# Patient Record
Sex: Female | Born: 1955 | Race: Black or African American | Hispanic: No | Smoking: Never smoker
Health system: Southern US, Community
[De-identification: ages and names within clinical notes are randomized; demographics above are authoritative.]

## PROBLEM LIST (undated history)

## (undated) DIAGNOSIS — I10 Essential (primary) hypertension: Secondary | ICD-10-CM

## (undated) DIAGNOSIS — E785 Hyperlipidemia, unspecified: Secondary | ICD-10-CM

## (undated) DIAGNOSIS — R609 Edema, unspecified: Secondary | ICD-10-CM

## (undated) HISTORY — DX: Edema, unspecified: R60.9

## (undated) HISTORY — PX: TUBAL LIGATION: SHX77

## (undated) HISTORY — DX: Essential (primary) hypertension: I10

## (undated) HISTORY — DX: Hyperlipidemia, unspecified: E78.5

---

## 1997-09-28 ENCOUNTER — Inpatient Hospital Stay (HOSPITAL_COMMUNITY): Admission: AD | Admit: 1997-09-28 | Discharge: 1997-09-30 | Payer: Self-pay | Admitting: Obstetrics and Gynecology

## 1997-10-31 ENCOUNTER — Other Ambulatory Visit: Admission: RE | Admit: 1997-10-31 | Discharge: 1997-10-31 | Payer: Self-pay | Admitting: Obstetrics and Gynecology

## 1998-07-09 ENCOUNTER — Other Ambulatory Visit: Admission: RE | Admit: 1998-07-09 | Discharge: 1998-07-09 | Payer: Self-pay | Admitting: Obstetrics and Gynecology

## 2004-11-22 ENCOUNTER — Emergency Department (HOSPITAL_COMMUNITY): Admission: EM | Admit: 2004-11-22 | Discharge: 2004-11-22 | Payer: Self-pay | Admitting: Emergency Medicine

## 2005-01-21 ENCOUNTER — Other Ambulatory Visit: Admission: RE | Admit: 2005-01-21 | Discharge: 2005-01-21 | Payer: Self-pay | Admitting: Internal Medicine

## 2005-02-11 ENCOUNTER — Encounter: Admission: RE | Admit: 2005-02-11 | Discharge: 2005-02-11 | Payer: Self-pay | Admitting: Internal Medicine

## 2006-02-05 ENCOUNTER — Emergency Department (HOSPITAL_COMMUNITY): Admission: EM | Admit: 2006-02-05 | Discharge: 2006-02-05 | Payer: Self-pay | Admitting: Emergency Medicine

## 2006-08-04 ENCOUNTER — Other Ambulatory Visit: Admission: RE | Admit: 2006-08-04 | Discharge: 2006-08-04 | Payer: Self-pay | Admitting: Internal Medicine

## 2006-09-12 HISTORY — PX: EYE SURGERY: SHX253

## 2007-10-12 ENCOUNTER — Other Ambulatory Visit: Admission: RE | Admit: 2007-10-12 | Discharge: 2007-10-12 | Payer: Self-pay | Admitting: Internal Medicine

## 2008-05-23 ENCOUNTER — Ambulatory Visit: Payer: Self-pay | Admitting: Internal Medicine

## 2008-11-28 ENCOUNTER — Ambulatory Visit: Payer: Self-pay | Admitting: Internal Medicine

## 2009-08-07 ENCOUNTER — Ambulatory Visit: Payer: Self-pay | Admitting: Internal Medicine

## 2010-03-09 ENCOUNTER — Emergency Department (HOSPITAL_COMMUNITY): Admission: EM | Admit: 2010-03-09 | Discharge: 2010-03-09 | Payer: Self-pay | Admitting: Emergency Medicine

## 2010-03-22 ENCOUNTER — Ambulatory Visit: Payer: Self-pay | Admitting: Internal Medicine

## 2010-09-21 ENCOUNTER — Ambulatory Visit (INDEPENDENT_AMBULATORY_CARE_PROVIDER_SITE_OTHER): Payer: Self-pay | Admitting: Internal Medicine

## 2010-09-21 DIAGNOSIS — E119 Type 2 diabetes mellitus without complications: Secondary | ICD-10-CM

## 2010-10-27 ENCOUNTER — Other Ambulatory Visit: Payer: Self-pay | Admitting: Internal Medicine

## 2010-10-27 DIAGNOSIS — Z1231 Encounter for screening mammogram for malignant neoplasm of breast: Secondary | ICD-10-CM

## 2010-11-02 ENCOUNTER — Ambulatory Visit
Admission: RE | Admit: 2010-11-02 | Discharge: 2010-11-02 | Disposition: A | Discharge status: Home / Self Care | Payer: Commercial Managed Care - PPO | Source: Ambulatory Visit | Attending: Internal Medicine | Admitting: Internal Medicine

## 2010-11-02 DIAGNOSIS — Z1231 Encounter for screening mammogram for malignant neoplasm of breast: Secondary | ICD-10-CM

## 2010-11-12 ENCOUNTER — Ambulatory Visit: Payer: Commercial Managed Care - PPO

## 2011-04-19 ENCOUNTER — Encounter: Payer: Self-pay | Admitting: Internal Medicine

## 2011-04-22 ENCOUNTER — Encounter: Payer: Self-pay | Admitting: Internal Medicine

## 2011-04-22 ENCOUNTER — Other Ambulatory Visit (HOSPITAL_COMMUNITY)
Admission: RE | Admit: 2011-04-22 | Discharge: 2011-04-22 | Disposition: A | Discharge status: Home / Self Care | Payer: 59 | Source: Ambulatory Visit | Attending: Internal Medicine | Admitting: Internal Medicine

## 2011-04-22 ENCOUNTER — Ambulatory Visit (INDEPENDENT_AMBULATORY_CARE_PROVIDER_SITE_OTHER): Payer: 59 | Admitting: Internal Medicine

## 2011-04-22 ENCOUNTER — Other Ambulatory Visit: Payer: Self-pay | Admitting: Internal Medicine

## 2011-04-22 DIAGNOSIS — N951 Menopausal and female climacteric states: Secondary | ICD-10-CM

## 2011-04-22 DIAGNOSIS — E119 Type 2 diabetes mellitus without complications: Secondary | ICD-10-CM

## 2011-04-22 DIAGNOSIS — Z01419 Encounter for gynecological examination (general) (routine) without abnormal findings: Secondary | ICD-10-CM | POA: Insufficient documentation

## 2011-04-22 DIAGNOSIS — R609 Edema, unspecified: Secondary | ICD-10-CM | POA: Insufficient documentation

## 2011-04-22 DIAGNOSIS — H409 Unspecified glaucoma: Secondary | ICD-10-CM | POA: Insufficient documentation

## 2011-04-22 DIAGNOSIS — Z124 Encounter for screening for malignant neoplasm of cervix: Secondary | ICD-10-CM

## 2011-04-22 DIAGNOSIS — E559 Vitamin D deficiency, unspecified: Secondary | ICD-10-CM

## 2011-04-22 DIAGNOSIS — Z0000 Encounter for general adult medical examination without abnormal findings: Secondary | ICD-10-CM

## 2011-04-22 DIAGNOSIS — H109 Unspecified conjunctivitis: Secondary | ICD-10-CM

## 2011-04-22 LAB — CBC WITH DIFFERENTIAL/PLATELET
Basophils Relative: 0 % (ref 0–1)
Eosinophils Relative: 3 % (ref 0–5)
Lymphocytes Relative: 43 % (ref 12–46)
Lymphs Abs: 2.5 10*3/uL (ref 0.7–4.0)
MCH: 29.8 pg (ref 26.0–34.0)
Monocytes Relative: 6 % (ref 3–12)
Neutro Abs: 2.8 10*3/uL (ref 1.7–7.7)
Neutrophils Relative %: 48 % (ref 43–77)
RBC: 4.4 MIL/uL (ref 3.87–5.11)
WBC: 5.9 10*3/uL (ref 4.0–10.5)

## 2011-04-22 LAB — POCT URINALYSIS DIPSTICK
Bilirubin, UA: NEGATIVE
Blood, UA: NEGATIVE
Ketones, UA: NEGATIVE
Leukocytes, UA: NEGATIVE
Protein, UA: NEGATIVE
Urobilinogen, UA: NEGATIVE

## 2011-04-22 LAB — COMPREHENSIVE METABOLIC PANEL
Albumin: 4.1 g/dL (ref 3.5–5.2)
CO2: 27 mEq/L (ref 19–32)
Chloride: 102 mEq/L (ref 96–112)
Glucose, Bld: 86 mg/dL (ref 70–99)
Sodium: 139 mEq/L (ref 135–145)
Total Bilirubin: 0.7 mg/dL (ref 0.3–1.2)

## 2011-04-22 LAB — TSH: TSH: 1.11 u[IU]/mL (ref 0.350–4.500)

## 2011-04-22 LAB — HEMOGLOBIN A1C: Mean Plasma Glucose: 126 mg/dL — ABNORMAL HIGH (ref <117)

## 2011-04-22 LAB — LIPID PANEL
Cholesterol: 231 mg/dL — ABNORMAL HIGH (ref 0–200)
VLDL: 12 mg/dL (ref 0–40)

## 2011-04-22 NOTE — Progress Notes (Signed)
  Subjective:    Patient ID: Allison Shields, female    DOB: 1955/12/07, 55 y.o.   MRN: [id number]  HPI 55 year old black female with history of glaucoma, diabetes mellitus, dependent edema and vitamin D deficiency for health maintenance exam and evaluation of medical problems. Patient works as a Advertising copywriter at [employer]. Recently had a bout of conjunctivitis and saw Dr. Eulah Pont and was treated with ofloxacin ophthalmic drops. It seems to have recurred. We have arranged for her to see ophthalmologist this afternoon. Overall doing well with good control of her glucose intolerance just with diet alone. She's not been taking vitamin D supplement. Once again reminded her to take 2000 units vitamin D over-the-counter. She takes Maxzide 75/50 every other day for dependent edema. Says she's had a recent mammogram but I do not have copy of that. Became menopausal about a year ago. Had Pneumovax immunization 2007. Tetanus immunization 2010 he gets annual influenza immunization through employment. In April 2008 and glaucoma surgery right. Bilateral tubal ligation 1999. No Pap since 2009. Does get annual eye exam per ophthalmologist.    Review of Systems  Constitutional: Negative.   HENT: Negative.   Eyes: Positive for redness.  Respiratory: Negative.   Cardiovascular: Negative.   Gastrointestinal: Negative.   Genitourinary: Negative.   Musculoskeletal: Negative.   Skin: Negative.   Neurological: Negative.   Hematological: Negative.   Psychiatric/Behavioral: Negative.        Objective:   Physical Exam  Vitals reviewed. Constitutional: She is oriented to person, place, and time. She appears well-developed.  HENT:  Head: Normocephalic and atraumatic.  Right Ear: External ear normal.  Left Ear: External ear normal.  Mouth/Throat: Oropharynx is clear and moist. No oropharyngeal exudate.  Eyes: EOM are normal. Pupils are equal, round, and reactive to light. No scleral icterus.       Right eye has  "bubble" place to control ocular pressure. Right eye is red  Neck: Neck supple. No JVD present. No thyromegaly present.  Cardiovascular: Normal rate, regular rhythm, normal heart sounds and intact distal pulses.   No murmur heard. Pulmonary/Chest: Effort normal and breath sounds normal. She has no wheezes. She has no rales.  Abdominal: Soft. Bowel sounds are normal. She exhibits no mass. There is no rebound.  Genitourinary: Vagina normal and uterus normal. No vaginal discharge found.       Pap smear taken  Musculoskeletal: Normal range of motion. She exhibits no edema.  Lymphadenopathy:    She has no cervical adenopathy.  Neurological: She is alert and oriented to person, place, and time. She has normal reflexes. No cranial nerve deficit. Coordination normal.  Skin: Skin is warm and dry. No rash noted.  Psychiatric: She has a normal mood and affect. Her behavior is normal. Judgment and thought content normal.          Assessment & Plan:  Diabetes mellitus-diet controlled  Conjunctivitis right eye-appointment to see ophthalmologist this afternoo  History of glaucoma  Dependent edema  Plan: Return in 6 months for hemoglobin A1c. At her request phentermine 37.5 mg (#90) one by mouth daily to assist with weight loss efforts. We only prescribe this for her sparingly. Refill Maxzide 75/50 #90 one by mouth every other day with when necessary 1 year refill.   Menopausal-no menses in one year  Vitamin D deficiency-take 2000 units daily over-the-counter vitamin D 3

## 2011-04-22 NOTE — Patient Instructions (Signed)
Continue to watch diet and lose a little bit of weight. Take 2000 units vitamin D 3 over-the-counter daily. Have written prescription for new home glucose monitor. Return in 6 months.

## 2011-04-23 LAB — VITAMIN D 25 HYDROXY (VIT D DEFICIENCY, FRACTURES): Vit D, 25-Hydroxy: 43 ng/mL (ref 30–89)

## 2011-04-25 ENCOUNTER — Telehealth: Payer: Self-pay | Admitting: Internal Medicine

## 2011-04-25 ENCOUNTER — Other Ambulatory Visit: Payer: Self-pay

## 2011-04-25 MED ORDER — GLUCOSE BLOOD VI STRP: ORAL_STRIP | Status: AC

## 2011-04-25 NOTE — Telephone Encounter (Signed)
Rx written for #180 with prn one year refills to use bid.

## 2012-06-08 ENCOUNTER — Encounter: Payer: Self-pay | Admitting: Internal Medicine

## 2012-06-08 ENCOUNTER — Ambulatory Visit (INDEPENDENT_AMBULATORY_CARE_PROVIDER_SITE_OTHER): Payer: 59 | Admitting: Internal Medicine

## 2012-06-08 VITALS — BP 120/78 | HR 84 | Ht 63.75 in | Wt 211.0 lb

## 2012-06-08 DIAGNOSIS — R609 Edema, unspecified: Secondary | ICD-10-CM

## 2012-06-08 DIAGNOSIS — E119 Type 2 diabetes mellitus without complications: Secondary | ICD-10-CM

## 2012-06-08 DIAGNOSIS — Z0000 Encounter for general adult medical examination without abnormal findings: Secondary | ICD-10-CM

## 2012-06-08 DIAGNOSIS — E785 Hyperlipidemia, unspecified: Secondary | ICD-10-CM

## 2012-06-08 DIAGNOSIS — H409 Unspecified glaucoma: Secondary | ICD-10-CM

## 2012-06-08 DIAGNOSIS — E669 Obesity, unspecified: Secondary | ICD-10-CM

## 2012-06-08 DIAGNOSIS — E559 Vitamin D deficiency, unspecified: Secondary | ICD-10-CM

## 2012-06-08 LAB — COMPREHENSIVE METABOLIC PANEL
AST: 14 U/L (ref 0–37)
Albumin: 4.1 g/dL (ref 3.5–5.2)
BUN: 17 mg/dL (ref 6–23)
Glucose, Bld: 89 mg/dL (ref 70–99)

## 2012-06-08 LAB — POCT URINALYSIS DIPSTICK
Bilirubin, UA: NEGATIVE
Blood, UA: NEGATIVE
Nitrite, UA: NEGATIVE
Protein, UA: NEGATIVE
Spec Grav, UA: 1.015
pH, UA: 6

## 2012-06-08 LAB — CBC WITH DIFFERENTIAL/PLATELET
Basophils Absolute: 0 10*3/uL (ref 0.0–0.1)
MCH: 30.2 pg (ref 26.0–34.0)
MCHC: 34 g/dL (ref 30.0–36.0)
Monocytes Relative: 8 % (ref 3–12)
Neutro Abs: 3.9 10*3/uL (ref 1.7–7.7)
Neutrophils Relative %: 55 % (ref 43–77)
Platelets: 269 10*3/uL (ref 150–400)

## 2012-06-08 LAB — LIPID PANEL
Cholesterol: 224 mg/dL — ABNORMAL HIGH (ref 0–200)
HDL: 61 mg/dL (ref >39)
LDL Cholesterol: 146 mg/dL — ABNORMAL HIGH (ref 0–99)
Triglycerides: 86 mg/dL (ref <150)

## 2012-06-08 LAB — HEMOGLOBIN A1C
Hgb A1c MFr Bld: 6 % — ABNORMAL HIGH (ref <5.7)
Mean Plasma Glucose: 126 mg/dL — ABNORMAL HIGH (ref <117)

## 2012-06-08 NOTE — Patient Instructions (Addendum)
We have prescribed Maxide 75/50 for dependent edema to take daily. Previously was taking Maxide 25 daily. Have prescribed phentermine 37.5 mg to take 1 by mouth daily for weight loss #100 no refill. Patient encouraged diet and exercise. She's gained 11 pounds since last year.  Need to start Zocor 10 mg daily for hyperlipidemia and recheck in 6 months

## 2012-06-09 LAB — VITAMIN D 25 HYDROXY (VIT D DEFICIENCY, FRACTURES): Vit D, 25-Hydroxy: 49 ng/mL (ref 30–89)

## 2012-06-17 NOTE — Progress Notes (Signed)
  Subjective:    Patient ID: Allison Shields, female    DOB: 01-19-1956, 57 y.o.   MRN: [id number]  HPI 57 year old Black female with history of Dolcin diabetes, dependent edema for health maintenance and evaluation of medical problems. No known drug allergies.  Past medical history: Bilateral tubal ligation 1999. Glaucoma surgery right 2008.  Patient had Pneumovax immunization 2007. Tetanus immunization 2010. Gets annual influenza immunization through employment.  Ophthalmologist is Dr. Delford Field well [facility] location here in [place]. Gets annual mammogram. History of trichomonads August 2006. History of vitamin D deficiency.  Social history: Patient has a high school education. Is separated from her husband. Has 2 sons. 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  Family history: Father died of complications of a stroke with history of diabetes. Mother with history of hyperlipidemia and hypertension. One brother alive and well. One sister die with GI malignancy. One sister has sarcoidosis. Another sister with morbid obesity. Total of 7 sisters.  Patient does not smoke or consume alcohol.  Diabetes is diet controlled. History of moderate hyperlipidemia in 2012. LDL cholesterol was 161 with total cholesterol of 231 and HDL cholesterol of 58.    Review of Systems  Constitutional: Negative.   Eyes: Negative.   Respiratory: Negative.   Cardiovascular: Negative.   Gastrointestinal: Negative.   Genitourinary: Negative.   Neurological: Negative.   Hematological: Negative.   Psychiatric/Behavioral: Negative.        Objective:   Physical Exam  Vitals reviewed. Constitutional: She is oriented to person, place, and time. She appears well-developed and well-nourished. No distress.  HENT:  Head: Normocephalic and atraumatic.  Right Ear: External ear normal.  Left Ear: External ear normal.  Mouth/Throat: Oropharynx is clear and moist. No oropharyngeal exudate.    Eyes: Conjunctivae normal and EOM are normal. Pupils are equal, round, and reactive to light. Right eye exhibits no discharge. Left eye exhibits no discharge.  Neck: Neck supple. No JVD present. No thyromegaly present.  Cardiovascular: Normal rate, regular rhythm, normal heart sounds and intact distal pulses.   No murmur heard. Pulmonary/Chest: Effort normal and breath sounds normal. She has no wheezes. She has no rales.       Breasts normal female.  Abdominal: Soft. Bowel sounds are normal. She exhibits no distension and no mass. There is no tenderness. There is no rebound and no guarding.  Musculoskeletal: Normal range of motion. She exhibits no edema.  Lymphadenopathy:    She has no cervical adenopathy.  Neurological: She is oriented to person, place, and time. She has normal reflexes. She displays normal reflexes. No cranial nerve deficit. Coordination normal.  Skin: Skin is warm and dry. No rash noted. She is not diaphoretic.  Psychiatric: She has a normal mood and affect. Her behavior is normal. Judgment and thought content normal.          Assessment & Plan:  Type 2 diabetes mellitus diet controlled  Dependent edema  History of vitamin D deficiency  Glaucoma  Menopause-became menopausal approximately 2011  Hyperlipidemia  Plan: Refill Maxzide 75/50 to take daily for dependent edema. At her request prescribe phentermine 37.5 mg (#100) one by mouth daily for weight loss. She needs to lose 11 pounds she's gained since last year plus a bit more. Also they she should be on Zocor 10 mg daily for hyperlipidemia with plans to recheck lipid panel liver functions with office visit in 6 months.

## 2012-06-26 ENCOUNTER — Other Ambulatory Visit: Payer: Self-pay

## 2012-06-26 MED ORDER — SIMVASTATIN 10 MG PO TABS: 10.0000 mg | ORAL_TABLET | Freq: Every day | ORAL | Status: DC

## 2012-06-26 NOTE — Telephone Encounter (Signed)
Patient informed of lipids and the need to start Zocor. Rx sent to [facility]. Will recheck lipid and liver functions in 6 months. Patient is declining colonoscopy at this time. States she will do one later this year.

## 2012-10-26 ENCOUNTER — Other Ambulatory Visit: Payer: Self-pay

## 2012-10-26 DIAGNOSIS — Z1231 Encounter for screening mammogram for malignant neoplasm of breast: Secondary | ICD-10-CM

## 2012-11-09 ENCOUNTER — Ambulatory Visit
Admission: RE | Admit: 2012-11-09 | Discharge: 2012-11-09 | Disposition: A | Discharge status: Home / Self Care | Payer: 59 | Source: Ambulatory Visit

## 2012-11-09 DIAGNOSIS — Z1231 Encounter for screening mammogram for malignant neoplasm of breast: Secondary | ICD-10-CM

## 2012-12-21 ENCOUNTER — Ambulatory Visit: Payer: 59 | Admitting: Internal Medicine

## 2013-01-04 ENCOUNTER — Ambulatory Visit (INDEPENDENT_AMBULATORY_CARE_PROVIDER_SITE_OTHER): Payer: 59 | Admitting: Internal Medicine

## 2013-01-04 ENCOUNTER — Encounter: Payer: Self-pay | Admitting: Internal Medicine

## 2013-01-04 VITALS — BP 126/74 | HR 76 | Temp 98.0°F | Wt 210.0 lb

## 2013-01-04 DIAGNOSIS — Z79899 Other long term (current) drug therapy: Secondary | ICD-10-CM

## 2013-01-04 DIAGNOSIS — E8881 Metabolic syndrome: Secondary | ICD-10-CM | POA: Insufficient documentation

## 2013-01-04 DIAGNOSIS — E119 Type 2 diabetes mellitus without complications: Secondary | ICD-10-CM

## 2013-01-04 DIAGNOSIS — E669 Obesity, unspecified: Secondary | ICD-10-CM

## 2013-01-04 DIAGNOSIS — E785 Hyperlipidemia, unspecified: Secondary | ICD-10-CM

## 2013-01-04 LAB — HEPATIC FUNCTION PANEL
ALT: 15 U/L (ref 0–35)
AST: 16 U/L (ref 0–37)
Bilirubin, Direct: 0.1 mg/dL (ref 0.0–0.3)

## 2013-01-04 LAB — LIPID PANEL
HDL: 54 mg/dL (ref >39)
LDL Cholesterol: 119 mg/dL — ABNORMAL HIGH (ref 0–99)

## 2013-01-04 NOTE — Progress Notes (Signed)
  Subjective:    Patient ID: Allison Shields, female    DOB: May 01, 1956, 57 y.o.   MRN: [id number]  HPI  57 year old Black female with history of controlled Type 2 diabetes diet controlled only, dependent edema and hyperlipidemia for 6 month recheck. No complaints. Lost one pound since last office visit. Eye exam per Dr. Eulah Pont on [facility] seen in December 12, 2012.     Review of Systems     Objective:   Physical Exam skin is warm and dry. Diabetic foot exam has calluses on her heels. No ulcers. Pulses are normal in feet. Neck is supple without JVD thyromegaly or carotid bruits. Chest clear to auscultation. Cardiac exam regular rate and rhythm normal S1 and S2 without murmurs. Extremities without pitting edema.        Assessment & Plan:  Dependent edema-controlled with diuretic and stable  Diet controlled diabetes mellitus-hemoglobin A1c is drawn and is pending  Hyperlipidemia-has not been compliant with daily dose of statin medication. Results are pending.  Obesity-patient has lost only 1 pound in the past 6 months. She wants to try phentermine 37.5 mg gave her #100 with no refill. Have her she must it serious about diet exercise and needs to lose some weight.  Calluses on feet. Recommend moisturizer on a daily basis.  Plan: Return in 6 months for physical exam. Prescription given for new home glucose monitor. Her since broken and she's not been checking Accu-Cheks 2-3 times weekly as advised.

## 2013-01-04 NOTE — Patient Instructions (Addendum)
Recommend diet exercise and weight loss. Return in 6 months. New prescription for home glucose monitor. Take statin medication on a daily basis.

## 2013-01-07 ENCOUNTER — Other Ambulatory Visit: Payer: Self-pay | Admitting: Internal Medicine

## 2013-01-07 NOTE — Telephone Encounter (Signed)
Please send separate Rx as requested

## 2013-01-07 NOTE — Telephone Encounter (Signed)
Rxs written for glucose monitor and test strips to use daily

## 2013-04-15 ENCOUNTER — Other Ambulatory Visit: Payer: Self-pay | Admitting: Internal Medicine

## 2013-08-02 ENCOUNTER — Other Ambulatory Visit (HOSPITAL_COMMUNITY)
Admission: RE | Admit: 2013-08-02 | Discharge: 2013-08-02 | Disposition: A | Discharge status: Home / Self Care | Payer: 59 | Source: Ambulatory Visit | Attending: Internal Medicine | Admitting: Internal Medicine

## 2013-08-02 ENCOUNTER — Ambulatory Visit (INDEPENDENT_AMBULATORY_CARE_PROVIDER_SITE_OTHER): Payer: 59 | Admitting: Internal Medicine

## 2013-08-02 ENCOUNTER — Encounter: Payer: Self-pay | Admitting: Internal Medicine

## 2013-08-02 VITALS — BP 132/74 | HR 64 | Temp 98.4°F | Ht 63.75 in | Wt 218.0 lb

## 2013-08-02 DIAGNOSIS — Z8669 Personal history of other diseases of the nervous system and sense organs: Secondary | ICD-10-CM

## 2013-08-02 DIAGNOSIS — E669 Obesity, unspecified: Secondary | ICD-10-CM

## 2013-08-02 DIAGNOSIS — Z8679 Personal history of other diseases of the circulatory system: Secondary | ICD-10-CM

## 2013-08-02 DIAGNOSIS — Z01419 Encounter for gynecological examination (general) (routine) without abnormal findings: Secondary | ICD-10-CM | POA: Insufficient documentation

## 2013-08-02 DIAGNOSIS — Z130 Encounter for screening for diseases of the blood and blood-forming organs and certain disorders involving the immune mechanism: Secondary | ICD-10-CM

## 2013-08-02 DIAGNOSIS — E119 Type 2 diabetes mellitus without complications: Secondary | ICD-10-CM

## 2013-08-02 DIAGNOSIS — E785 Hyperlipidemia, unspecified: Secondary | ICD-10-CM

## 2013-08-02 DIAGNOSIS — Z23 Encounter for immunization: Secondary | ICD-10-CM

## 2013-08-02 DIAGNOSIS — Z1329 Encounter for screening for other suspected endocrine disorder: Secondary | ICD-10-CM

## 2013-08-02 DIAGNOSIS — E559 Vitamin D deficiency, unspecified: Secondary | ICD-10-CM

## 2013-08-02 DIAGNOSIS — Z79899 Other long term (current) drug therapy: Secondary | ICD-10-CM

## 2013-08-02 DIAGNOSIS — Z0000 Encounter for general adult medical examination without abnormal findings: Secondary | ICD-10-CM

## 2013-08-02 LAB — POCT URINALYSIS DIPSTICK
Bilirubin, UA: NEGATIVE
GLUCOSE UA: NEGATIVE
Ketones, UA: NEGATIVE
LEUKOCYTES UA: NEGATIVE
Nitrite, UA: NEGATIVE
Protein, UA: NEGATIVE
RBC UA: NEGATIVE
Spec Grav, UA: 1.02
Urobilinogen, UA: NEGATIVE
pH, UA: 6

## 2013-08-02 LAB — CBC WITH DIFFERENTIAL/PLATELET
BASOS PCT: 1 % (ref 0–1)
Basophils Absolute: 0.1 10*3/uL (ref 0.0–0.1)
EOS ABS: 0.1 10*3/uL (ref 0.0–0.7)
EOS PCT: 2 % (ref 0–5)
HEMATOCRIT: 35.5 % — AB (ref 36.0–46.0)
HEMOGLOBIN: 12 g/dL (ref 12.0–15.0)
LYMPHS ABS: 2.5 10*3/uL (ref 0.7–4.0)
LYMPHS PCT: 41 % (ref 12–46)
MCH: 30.4 pg (ref 26.0–34.0)
MCHC: 33.8 g/dL (ref 30.0–36.0)
MCV: 89.9 fL (ref 78.0–100.0)
MONO ABS: 0.5 10*3/uL (ref 0.1–1.0)
MONOS PCT: 8 % (ref 3–12)
Neutro Abs: 2.9 10*3/uL (ref 1.7–7.7)
Neutrophils Relative %: 48 % (ref 43–77)
PLATELETS: 268 10*3/uL (ref 150–400)
RBC: 3.95 MIL/uL (ref 3.87–5.11)
RDW: 13.4 % (ref 11.5–15.5)
WBC: 6 10*3/uL (ref 4.0–10.5)

## 2013-08-02 LAB — COMPREHENSIVE METABOLIC PANEL
ALK PHOS: 81 U/L (ref 39–117)
ALT: 11 U/L (ref 0–35)
AST: 14 U/L (ref 0–37)
Albumin: 3.8 g/dL (ref 3.5–5.2)
BUN: 14 mg/dL (ref 6–23)
CHLORIDE: 107 meq/L (ref 96–112)
CO2: 27 meq/L (ref 19–32)
Calcium: 8.8 mg/dL (ref 8.4–10.5)
Creat: 0.88 mg/dL (ref 0.50–1.10)
Glucose, Bld: 90 mg/dL (ref 70–99)
POTASSIUM: 3.9 meq/L (ref 3.5–5.3)
Sodium: 141 mEq/L (ref 135–145)
Total Bilirubin: 0.5 mg/dL (ref 0.2–1.2)
Total Protein: 6.9 g/dL (ref 6.0–8.3)

## 2013-08-02 LAB — LIPID PANEL
CHOL/HDL RATIO: 3.1 ratio
Cholesterol: 166 mg/dL (ref 0–200)
HDL: 53 mg/dL (ref >39)
LDL Cholesterol: 99 mg/dL (ref 0–99)
Triglycerides: 69 mg/dL (ref <150)
VLDL: 14 mg/dL (ref 0–40)

## 2013-08-02 LAB — TSH: TSH: 1.015 u[IU]/mL (ref 0.350–4.500)

## 2013-08-02 LAB — HEMOGLOBIN A1C
HEMOGLOBIN A1C: 6.3 % — AB (ref <5.7)
Mean Plasma Glucose: 134 mg/dL — ABNORMAL HIGH (ref <117)

## 2013-08-02 MED ORDER — PNEUMOCOCCAL VAC POLYVALENT 25 MCG/0.5ML IJ INJ: 0.5000 mL | INJECTION | INTRAMUSCULAR | Status: DC

## 2013-08-02 NOTE — Progress Notes (Signed)
Subjective:    Patient ID: Allison Shields, female    DOB: 1956-02-13, 58 y.o.   MRN: [id number]  HPI 58 year old 77 female in today for health maintenance exam. She has a history of diabetes mellitus which is diet controlled, hyperlipidemia treated with Zocor, metabolic syndrome, obesity, dependent edema. She takes diuretic for dependent edema. History of glaucoma and vitamin D deficiency. Patient thinks that Zocor which she takes at night has been causing  insomnia. Have asked her to take it during the day. Patient has gained 7 pounds since December 2013.  No known drug allergies.  Past medical history: Bilateral tubal ligation 1999. Glaucoma surgery right eye 2008. Ophthalmologist is at [facility] and she is seen q 6 months due to glaucoma.  Tetanus immunization 2010. Gets annual influenza immunization through employment. Pneumovax immunization 2007. Treated for trichomonads August 2006.  Social history: She has a high school education. Is separated from her husband. Has 2 sons. 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 Does not smoke or consume alcohol.  Family history: Father died of complications of a stroke with history of diabetes. Mother with history of hyperlipidemia, glaucoma, and hypertension. One brother alive and well. One sister died with GI malignancy. One sister with remote history of sarcoidosis. Another sister with morbid obesity. Total of 7 sisters.    Review of Systems  Constitutional: Negative.   All other systems reviewed and are negative.      Objective:   Physical Exam  Vitals reviewed. Constitutional: She is oriented to person, place, and time. She appears well-developed and well-nourished. No distress.  HENT:  Head: Normocephalic and atraumatic.  Right Ear: External ear normal.  Left Ear: External ear normal.  Mouth/Throat: Oropharynx is clear and moist. No oropharyngeal exudate.  Eyes: Conjunctivae and EOM are normal. Pupils are  equal, round, and reactive to light. Right eye exhibits no discharge. Left eye exhibits no discharge. No scleral icterus.  Neck: Neck supple. No JVD present. No thyromegaly present.  Cardiovascular: Normal rate, regular rhythm, normal heart sounds and intact distal pulses.   No murmur heard. Pulmonary/Chest: Effort normal and breath sounds normal. No respiratory distress. She has no wheezes. She has no rales. She exhibits no tenderness.  Breasts normal female  Abdominal: Bowel sounds are normal. She exhibits no distension and no mass. There is no tenderness. There is no rebound and no guarding.  Genitourinary:  Pap taken --bimanual normal  Musculoskeletal: She exhibits no edema.  Lymphadenopathy:    She has no cervical adenopathy.  Neurological: She is alert and oriented to person, place, and time. She has normal reflexes. She displays normal reflexes. No cranial nerve deficit. Coordination normal.  Skin: Skin is dry. No rash noted. She is not diaphoretic.  Psychiatric: She has a normal mood and affect. Her behavior is normal. Judgment and thought content normal.          Assessment & Plan:  Controlled type 2 diabetes mellitus but hemoglobin A1c has increased to 6.3% from 5.9%.  Obesity  History of glaucoma  History of dependent edema  Hyperlipidemia stable with statin therapy  Plan: At her request, phentermine 37.5 mg #30 written prescription with no refill 1 by mouth daily to help jumpstart weight loss. I'm concerned about diabetes mellitus not being as well controlled as previously. Admits to not following a diet recently prescription for home glucose monitoring diabetic test strips to check once or twice daily. Call me with results. Followup in 6 months instead of one  year.

## 2013-08-03 LAB — VITAMIN D 25 HYDROXY (VIT D DEFICIENCY, FRACTURES): Vit D, 25-Hydroxy: 50 ng/mL (ref 30–89)

## 2013-10-16 ENCOUNTER — Other Ambulatory Visit: Payer: Self-pay

## 2013-10-16 DIAGNOSIS — Z1231 Encounter for screening mammogram for malignant neoplasm of breast: Secondary | ICD-10-CM

## 2013-11-29 ENCOUNTER — Ambulatory Visit: Payer: 59

## 2013-12-06 ENCOUNTER — Ambulatory Visit
Admission: RE | Admit: 2013-12-06 | Discharge: 2013-12-06 | Disposition: A | Discharge status: Home / Self Care | Payer: 59 | Source: Ambulatory Visit

## 2013-12-06 ENCOUNTER — Encounter (INDEPENDENT_AMBULATORY_CARE_PROVIDER_SITE_OTHER): Payer: Self-pay

## 2013-12-06 DIAGNOSIS — Z1231 Encounter for screening mammogram for malignant neoplasm of breast: Secondary | ICD-10-CM

## 2014-01-05 NOTE — Patient Instructions (Signed)
Monitor Accu-Cheks at home and call with results. Continue statin medication. Take phentermine daily for 30 days for weight loss. Return in 6 months for office visit in hemoglobin A 1C.

## 2014-01-31 ENCOUNTER — Encounter: Payer: Self-pay | Admitting: Internal Medicine

## 2014-01-31 ENCOUNTER — Ambulatory Visit (INDEPENDENT_AMBULATORY_CARE_PROVIDER_SITE_OTHER): Payer: 59 | Admitting: Internal Medicine

## 2014-01-31 VITALS — BP 116/76 | HR 80 | Wt 206.0 lb

## 2014-01-31 DIAGNOSIS — E119 Type 2 diabetes mellitus without complications: Secondary | ICD-10-CM

## 2014-01-31 DIAGNOSIS — Z79899 Other long term (current) drug therapy: Secondary | ICD-10-CM

## 2014-01-31 DIAGNOSIS — I10 Essential (primary) hypertension: Secondary | ICD-10-CM

## 2014-01-31 DIAGNOSIS — E785 Hyperlipidemia, unspecified: Secondary | ICD-10-CM

## 2014-01-31 NOTE — Patient Instructions (Signed)
Return in 6 months for physical exam. Continue same medications. Phentermine 37.5 mg given today #100 one by mouth daily. Watch diet and exercise.

## 2014-01-31 NOTE — Progress Notes (Signed)
   Subjective:    Patient ID: ANGELIQUE CHEVALIER, female    DOB: [birth year], 58 y.o.   MRN: [id number]  HPI  58 year old Black Female in today for six-month followup of diabetes and hypertension. Says Accu-Cheks have been excellent. She's lost 12 pounds since last visit. Wants another refill on phentermine. She was given #100 in February. Says she's watching her diet. Not exercising as much. I think is eating a little too much fast food.    Review of Systems     Objective:   Physical Exam Neck is supple without thyromegaly or carotid bruits. Chest clear. Cardiac exam regular rate and rhythm. Extremities without edema.       Assessment & Plan:  Hematoma left anterior shin secondary to recent fall-resolving  Controlled type 2 diabetes  Hyperlipidemia  Plan: Phentermine 37.5 mg #100 one by mouth daily. Return in February 2016 for physical examination. Continue same medications. Lipid panel hemoglobin A1c and urine microalbumin done today.

## 2014-02-01 LAB — BASIC METABOLIC PANEL
BUN: 16 mg/dL (ref 6–23)
CHLORIDE: 97 meq/L (ref 96–112)
CO2: 28 mEq/L (ref 19–32)
CREATININE: 0.93 mg/dL (ref 0.50–1.10)
Calcium: 9.4 mg/dL (ref 8.4–10.5)
Glucose, Bld: 89 mg/dL (ref 70–99)
POTASSIUM: 3.7 meq/L (ref 3.5–5.3)
Sodium: 132 mEq/L — ABNORMAL LOW (ref 135–145)

## 2014-02-01 LAB — HEMOGLOBIN A1C
Hgb A1c MFr Bld: 6.2 % — ABNORMAL HIGH (ref <5.7)
Mean Plasma Glucose: 131 mg/dL — ABNORMAL HIGH (ref <117)

## 2014-02-01 LAB — HEPATIC FUNCTION PANEL
ALK PHOS: 84 U/L (ref 39–117)
ALT: 10 U/L (ref 0–35)
AST: 16 U/L (ref 0–37)
Albumin: 4 g/dL (ref 3.5–5.2)
BILIRUBIN DIRECT: 0.1 mg/dL (ref 0.0–0.3)
BILIRUBIN INDIRECT: 0.4 mg/dL (ref 0.2–1.2)
Total Bilirubin: 0.5 mg/dL (ref 0.2–1.2)
Total Protein: 7.8 g/dL (ref 6.0–8.3)

## 2014-02-01 LAB — LIPID PANEL
Cholesterol: 184 mg/dL (ref 0–200)
HDL: 68 mg/dL (ref >39)
LDL CALC: 103 mg/dL — AB (ref 0–99)
Total CHOL/HDL Ratio: 2.7 Ratio
Triglycerides: 65 mg/dL (ref <150)
VLDL: 13 mg/dL (ref 0–40)

## 2014-02-01 LAB — MICROALBUMIN, URINE: Microalb, Ur: 0.5 mg/dL (ref 0.00–1.89)

## 2014-02-07 ENCOUNTER — Ambulatory Visit: Payer: 59 | Admitting: Internal Medicine

## 2014-02-14 ENCOUNTER — Other Ambulatory Visit (INDEPENDENT_AMBULATORY_CARE_PROVIDER_SITE_OTHER): Payer: 59 | Admitting: Internal Medicine

## 2014-02-14 DIAGNOSIS — Z1211 Encounter for screening for malignant neoplasm of colon: Secondary | ICD-10-CM

## 2014-02-14 LAB — HEMOCCULT GUIAC POC 1CARD (OFFICE)
FECAL OCCULT BLD: NEGATIVE
FECAL OCCULT BLD: NEGATIVE
Fecal Occult Blood, POC: NEGATIVE

## 2014-08-08 ENCOUNTER — Encounter: Payer: 59 | Admitting: Internal Medicine

## 2014-08-15 ENCOUNTER — Ambulatory Visit (INDEPENDENT_AMBULATORY_CARE_PROVIDER_SITE_OTHER): Payer: 59 | Admitting: Internal Medicine

## 2014-08-15 ENCOUNTER — Telehealth: Payer: Self-pay | Admitting: Internal Medicine

## 2014-08-15 ENCOUNTER — Encounter: Payer: Self-pay | Admitting: Internal Medicine

## 2014-08-15 VITALS — BP 110/78 | HR 79 | Temp 98.0°F | Ht 63.75 in | Wt 207.0 lb

## 2014-08-15 DIAGNOSIS — Z130 Encounter for screening for diseases of the blood and blood-forming organs and certain disorders involving the immune mechanism: Secondary | ICD-10-CM

## 2014-08-15 DIAGNOSIS — R8290 Unspecified abnormal findings in urine: Secondary | ICD-10-CM | POA: Diagnosis not present

## 2014-08-15 DIAGNOSIS — E785 Hyperlipidemia, unspecified: Secondary | ICD-10-CM | POA: Diagnosis not present

## 2014-08-15 DIAGNOSIS — H409 Unspecified glaucoma: Secondary | ICD-10-CM

## 2014-08-15 DIAGNOSIS — N390 Urinary tract infection, site not specified: Secondary | ICD-10-CM

## 2014-08-15 DIAGNOSIS — Z0000 Encounter for general adult medical examination without abnormal findings: Secondary | ICD-10-CM | POA: Diagnosis not present

## 2014-08-15 DIAGNOSIS — Z1321 Encounter for screening for nutritional disorder: Secondary | ICD-10-CM

## 2014-08-15 DIAGNOSIS — L509 Urticaria, unspecified: Secondary | ICD-10-CM | POA: Diagnosis not present

## 2014-08-15 DIAGNOSIS — Z8639 Personal history of other endocrine, nutritional and metabolic disease: Secondary | ICD-10-CM

## 2014-08-15 DIAGNOSIS — E669 Obesity, unspecified: Secondary | ICD-10-CM | POA: Diagnosis not present

## 2014-08-15 DIAGNOSIS — E119 Type 2 diabetes mellitus without complications: Secondary | ICD-10-CM

## 2014-08-15 DIAGNOSIS — Z1329 Encounter for screening for other suspected endocrine disorder: Secondary | ICD-10-CM

## 2014-08-15 DIAGNOSIS — R609 Edema, unspecified: Secondary | ICD-10-CM

## 2014-08-15 DIAGNOSIS — Z13220 Encounter for screening for lipoid disorders: Secondary | ICD-10-CM

## 2014-08-15 LAB — CBC WITH DIFFERENTIAL/PLATELET
BASOS ABS: 0 10*3/uL (ref 0.0–0.1)
Basophils Relative: 0 % (ref 0–1)
EOS ABS: 0.1 10*3/uL (ref 0.0–0.7)
EOS PCT: 1 % (ref 0–5)
HCT: 39.3 % (ref 36.0–46.0)
HEMOGLOBIN: 13 g/dL (ref 12.0–15.0)
LYMPHS ABS: 2.8 10*3/uL (ref 0.7–4.0)
LYMPHS PCT: 35 % (ref 12–46)
MCH: 30.2 pg (ref 26.0–34.0)
MCHC: 33.1 g/dL (ref 30.0–36.0)
MCV: 91.2 fL (ref 78.0–100.0)
MPV: 10.2 fL (ref 8.6–12.4)
Monocytes Absolute: 0.5 10*3/uL (ref 0.1–1.0)
Monocytes Relative: 6 % (ref 3–12)
NEUTROS PCT: 58 % (ref 43–77)
Neutro Abs: 4.6 10*3/uL (ref 1.7–7.7)
Platelets: 297 10*3/uL (ref 150–400)
RBC: 4.31 MIL/uL (ref 3.87–5.11)
RDW: 13.3 % (ref 11.5–15.5)
WBC: 7.9 10*3/uL (ref 4.0–10.5)

## 2014-08-15 LAB — POCT URINALYSIS DIPSTICK
BILIRUBIN UA: NEGATIVE
Blood, UA: NEGATIVE
GLUCOSE UA: NEGATIVE
KETONES UA: NEGATIVE
Nitrite, UA: NEGATIVE
Protein, UA: NEGATIVE
SPEC GRAV UA: 1.015
Urobilinogen, UA: NEGATIVE
pH, UA: 7

## 2014-08-15 MED ORDER — METHYLPREDNISOLONE ACETATE 80 MG/ML IJ SUSP
80.0000 mg | Freq: Once | INTRAMUSCULAR | Status: AC
  Administered 2014-08-15: 80 mg via INTRAMUSCULAR

## 2014-08-15 MED ORDER — PHENTERMINE HCL 37.5 MG PO CAPS: 37.5000 mg | ORAL_CAPSULE | ORAL | Status: DC

## 2014-08-15 NOTE — Telephone Encounter (Signed)
Erroneous encounter

## 2014-08-16 LAB — LIPID PANEL
Cholesterol: 194 mg/dL (ref 0–200)
HDL: 67 mg/dL (ref >=46)
LDL Cholesterol: 110 mg/dL — ABNORMAL HIGH (ref 0–99)
Total CHOL/HDL Ratio: 2.9 Ratio
Triglycerides: 86 mg/dL (ref <150)
VLDL: 17 mg/dL (ref 0–40)

## 2014-08-16 LAB — COMPLETE METABOLIC PANEL WITH GFR
ALT: 12 U/L (ref 0–35)
AST: 14 U/L (ref 0–37)
Albumin: 4.1 g/dL (ref 3.5–5.2)
Alkaline Phosphatase: 86 U/L (ref 39–117)
BILIRUBIN TOTAL: 0.4 mg/dL (ref 0.2–1.2)
BUN: 18 mg/dL (ref 6–23)
CO2: 25 mEq/L (ref 19–32)
Calcium: 9.8 mg/dL (ref 8.4–10.5)
Chloride: 100 mEq/L (ref 96–112)
Creat: 1.02 mg/dL (ref 0.50–1.10)
GFR, EST NON AFRICAN AMERICAN: 62 mL/min
GFR, Est African American: 71 mL/min
GLUCOSE: 96 mg/dL (ref 70–99)
POTASSIUM: 4.3 meq/L (ref 3.5–5.3)
SODIUM: 138 meq/L (ref 135–145)
Total Protein: 8.1 g/dL (ref 6.0–8.3)

## 2014-08-16 LAB — HEMOGLOBIN A1C
Hgb A1c MFr Bld: 6.2 % — ABNORMAL HIGH (ref <5.7)
Mean Plasma Glucose: 131 mg/dL — ABNORMAL HIGH (ref <117)

## 2014-08-16 LAB — MICROALBUMIN / CREATININE URINE RATIO
CREATININE, URINE: 77.1 mg/dL
MICROALB UR: 2.1 mg/dL — AB (ref <2.0)
Microalb Creat Ratio: 27.2 mg/g (ref 0.0–30.0)

## 2014-08-16 LAB — TSH: TSH: 0.842 u[IU]/mL (ref 0.350–4.500)

## 2014-08-16 LAB — VITAMIN D 25 HYDROXY (VIT D DEFICIENCY, FRACTURES): Vit D, 25-Hydroxy: 51 ng/mL (ref 30–100)

## 2014-08-17 LAB — URINE CULTURE: Colony Count: >=100000

## 2014-08-18 ENCOUNTER — Telehealth: Payer: Self-pay | Admitting: *Deleted

## 2014-08-18 MED ORDER — AMOXICILLIN 500 MG PO CAPS: 500.0000 mg | ORAL_CAPSULE | Freq: Three times a day (TID) | ORAL | Status: DC

## 2014-08-18 NOTE — Telephone Encounter (Signed)
Spoke with patient reviewed labs and gave patient instructions for antibiotic use

## 2014-08-20 ENCOUNTER — Other Ambulatory Visit: Payer: Self-pay | Admitting: Internal Medicine

## 2014-08-22 ENCOUNTER — Encounter: Payer: 59 | Admitting: Internal Medicine

## 2014-09-06 NOTE — Progress Notes (Signed)
Subjective:    Patient ID: Allison Shields, female    DOB: December 20, 1955, 59 y.o.   MRN: [id number]  HPI 59 year old black female in today for health maintenance exam. Has a history of diabetes mellitus which is diet control. History of hyperlipidemia treated with Zocor. History of metabolic syndrome, obesity, dependent edema. She takes diuretic for dependent edema. History of glaucoma and vitamin D deficiency.  No known drug allergies.  Past medical history: Bilateral tubal ligation 1999. Glaucoma surgery right eye 2008. Ophthalmologist is [facility] ophthalmology. She is seeing every 6 months due to glaucoma.  Tetanus immunization 2010. She gets annual influenza immunization through employment. Pneumovax immunization 2007.  History of trichomonads treated August 2006.  Social history: She has a high school education. She is separated from her husband. Has 2 sons. 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 Does not smoke or consume alcohol. Mother is Allison Shields who is also a patient here.  Family history: Father died of complications of a stroke with history of diabetes. Mother with history of hyperlipidemia, glaucoma, hypertension. One brother alive and well. One sister died with GI malignancy. One sister with remote history of sarcoidosis. Another sister with morbid obesity. Total of 7 sisters.  New complaint today is urticaria. This is been occurring now for several weeks. Doesn't understand what is causing it. Denies being under excessive stress.    Review of Systems  Constitutional: Negative.   All other systems reviewed and are negative.      Objective:   Physical Exam  Constitutional: She is oriented to person, place, and time. She appears well-developed and well-nourished. No distress.  HENT:  Head: Normocephalic and atraumatic.  Right Ear: External ear normal.  Left Ear: External ear normal.  Mouth/Throat: Oropharynx is clear and moist. No oropharyngeal exudate.    Eyes: Conjunctivae and EOM are normal. Pupils are equal, round, and reactive to light. Right eye exhibits no discharge. Left eye exhibits no discharge. No scleral icterus.  Neck: Neck supple. No JVD present. No thyromegaly present.  Cardiovascular: Normal rate, regular rhythm, normal heart sounds and intact distal pulses.   No murmur heard. Pulmonary/Chest: Effort normal and breath sounds normal. No respiratory distress. She has no wheezes. She has no rales. She exhibits no tenderness.  Breasts normal female without masses  Abdominal: Soft. Bowel sounds are normal. She exhibits no distension and no mass. There is no tenderness. There is no rebound and no guarding.  Genitourinary:  Pap taken 2015. Bimanual normal.  Musculoskeletal: Normal range of motion. She exhibits no edema.  Lymphadenopathy:    She has no cervical adenopathy.  Neurological: She is alert and oriented to person, place, and time. She has normal reflexes. No cranial nerve deficit. Coordination normal.  Skin: Skin is warm and dry. No rash noted. She is not diaphoretic.  Psychiatric: She has a normal mood and affect. Her behavior is normal. Judgment and thought content normal.  Vitals reviewed.         Assessment & Plan:  Controlled type 2 diabetes mellitus-controlled with diet. Hemoglobin A1c 6.2%  Hyperlipidemia-on statin therapy. LDL is 110. Encouraged diet exercise and weight loss  Obesity-last physical exam weight 218 pounds. Now weighs 207 pounds.  Metabolic syndrome  Dependent edema-treated with diuretic  History of glaucoma-seen in [facility] ophthalmology every 6 months  History of vitamin D deficiency-vitamin D level is normal at this time  Abnormal urinalysis-has 3+ LE on dipstick urine. Culture grew strep agalactiae. Treated with amoxicillin 500 mg 3  times daily for 7 days.  Urticaria-recommend Claritin 10 mg daily  Plan: Phentermine 37.5 mg #100 one by mouth daily for weight loss. This helps  jumpstart her weight loss efforts.

## 2014-09-07 ENCOUNTER — Encounter: Payer: Self-pay | Admitting: Internal Medicine

## 2014-09-07 NOTE — Patient Instructions (Signed)
Continue same medications. Work on diet exercise and weight loss. Take phentermine 37.5 mg daily for 3 months. Need to figure out a way to keep weight off.

## 2014-10-10 ENCOUNTER — Other Ambulatory Visit: Payer: Self-pay | Admitting: Internal Medicine

## 2014-11-21 ENCOUNTER — Other Ambulatory Visit: Payer: Self-pay

## 2014-11-21 DIAGNOSIS — Z1231 Encounter for screening mammogram for malignant neoplasm of breast: Secondary | ICD-10-CM

## 2014-12-23 ENCOUNTER — Ambulatory Visit
Admission: RE | Admit: 2014-12-23 | Discharge: 2014-12-23 | Disposition: A | Discharge status: Home / Self Care | Payer: 59 | Source: Ambulatory Visit

## 2014-12-23 DIAGNOSIS — Z1231 Encounter for screening mammogram for malignant neoplasm of breast: Secondary | ICD-10-CM

## 2015-02-20 ENCOUNTER — Ambulatory Visit: Payer: 59 | Admitting: Internal Medicine

## 2015-02-27 ENCOUNTER — Ambulatory Visit (INDEPENDENT_AMBULATORY_CARE_PROVIDER_SITE_OTHER): Payer: 59 | Admitting: Internal Medicine

## 2015-02-27 ENCOUNTER — Encounter: Payer: Self-pay | Admitting: Internal Medicine

## 2015-02-27 VITALS — BP 110/80 | HR 72 | Temp 98.0°F | Wt 204.0 lb

## 2015-02-27 DIAGNOSIS — M545 Low back pain, unspecified: Secondary | ICD-10-CM

## 2015-02-27 DIAGNOSIS — R5383 Other fatigue: Secondary | ICD-10-CM

## 2015-02-27 DIAGNOSIS — E669 Obesity, unspecified: Secondary | ICD-10-CM

## 2015-02-27 DIAGNOSIS — E119 Type 2 diabetes mellitus without complications: Secondary | ICD-10-CM

## 2015-02-27 DIAGNOSIS — E785 Hyperlipidemia, unspecified: Secondary | ICD-10-CM | POA: Diagnosis not present

## 2015-02-27 LAB — VITAMIN B12: Vitamin B-12: 720 pg/mL (ref 211–911)

## 2015-02-27 LAB — TSH: TSH: 1.429 u[IU]/mL (ref 0.350–4.500)

## 2015-02-27 LAB — LIPID PANEL
Cholesterol: 174 mg/dL (ref 125–200)
HDL: 59 mg/dL (ref >=46)
LDL CALC: 100 mg/dL (ref <130)
Total CHOL/HDL Ratio: 2.9 Ratio (ref <=5.0)
Triglycerides: 76 mg/dL (ref <150)
VLDL: 15 mg/dL (ref <30)

## 2015-02-27 MED ORDER — CYCLOBENZAPRINE HCL 10 MG PO TABS: ORAL_TABLET | ORAL | Status: DC

## 2015-02-28 LAB — HEMOGLOBIN A1C
Hgb A1c MFr Bld: 6.4 % — ABNORMAL HIGH (ref <5.7)
Mean Plasma Glucose: 137 mg/dL — ABNORMAL HIGH (ref <117)

## 2015-03-07 NOTE — Patient Instructions (Addendum)
Please work on diet exercise and weight loss. Continue Zocor. Take Flexeril as needed for musculoskeletal pain. Return in March 2017 for physical exam

## 2015-03-07 NOTE — Progress Notes (Signed)
   Subjective:    Patient ID: Allison Shields, female    DOB: July 28, 1955, 59 y.o.   MRN: [id number]  HPI In today to follow-up on controlled type 2 diabetes mellitus. Patient complaining of fatigue. TSH and B-12 levels normal. Hemoglobin A1c has increased 6.4%. Needs to get strict about diet and exercise. Cannot rely on diet pills alone.  She has a history of dependent edema for which she takes diuretic. Type 2 diabetes mellitus treated with diet. Issues with musculoskeletal pain. Muscle relaxant given.  Review of Systems     Objective:   Physical Exam Neck is supple without JVD thyromegaly or carotid bruits. Chest clear. Cardiac exam regular rate and rhythm normal S1 and S2. Extremities without pitting edema. Straight leg raising is negative at 90 bilaterally with normal muscle strength and deep tendon reflexes.       Assessment & Plan:  Dependent edema-when necessary diuretic therapy  Hyperlipidemia-continue statin medication  Controlled type 2 diabetes mellitus  Obesity  Musculoskeletal pain-left-sided low back pain without sciatica. Prescribed Flexeril.  Plan: Patient needs to get serious about diet exercise and weight loss. Cannot rely on diet pills alone.

## 2015-04-01 ENCOUNTER — Other Ambulatory Visit: Payer: Self-pay | Admitting: Internal Medicine

## 2015-04-02 ENCOUNTER — Other Ambulatory Visit: Payer: Self-pay | Admitting: Internal Medicine

## 2015-07-15 MED FILL — SIMVASTATIN 10 MG TABLET: 10 | 90 days supply | Qty: 90 | Fill #1

## 2015-07-15 MED FILL — TRIAMTERENE/HCTZ 75/50 TAB: 75-50 | 90 days supply | Qty: 90 | Fill #3

## 2015-08-28 ENCOUNTER — Ambulatory Visit (INDEPENDENT_AMBULATORY_CARE_PROVIDER_SITE_OTHER): Payer: 59 | Admitting: Internal Medicine

## 2015-08-28 ENCOUNTER — Encounter: Payer: Self-pay | Admitting: Internal Medicine

## 2015-08-28 VITALS — BP 132/80 | HR 79 | Temp 97.9°F | Ht 63.5 in | Wt 214.0 lb

## 2015-08-28 DIAGNOSIS — E669 Obesity, unspecified: Secondary | ICD-10-CM

## 2015-08-28 DIAGNOSIS — R7302 Impaired glucose tolerance (oral): Secondary | ICD-10-CM | POA: Diagnosis not present

## 2015-08-28 DIAGNOSIS — H40009 Preglaucoma, unspecified, unspecified eye: Secondary | ICD-10-CM | POA: Diagnosis not present

## 2015-08-28 DIAGNOSIS — E785 Hyperlipidemia, unspecified: Secondary | ICD-10-CM

## 2015-08-28 DIAGNOSIS — E6609 Other obesity due to excess calories: Secondary | ICD-10-CM | POA: Diagnosis not present

## 2015-08-28 DIAGNOSIS — Z0000 Encounter for general adult medical examination without abnormal findings: Secondary | ICD-10-CM | POA: Diagnosis not present

## 2015-08-28 DIAGNOSIS — E559 Vitamin D deficiency, unspecified: Secondary | ICD-10-CM

## 2015-08-28 DIAGNOSIS — H409 Unspecified glaucoma: Secondary | ICD-10-CM | POA: Diagnosis not present

## 2015-08-28 DIAGNOSIS — R609 Edema, unspecified: Secondary | ICD-10-CM

## 2015-08-28 LAB — HEMOGLOBIN A1C
HEMOGLOBIN A1C: 6.2 % — AB (ref <5.7)
MEAN PLASMA GLUCOSE: 131 mg/dL — AB (ref <117)

## 2015-08-28 LAB — CBC WITH DIFFERENTIAL/PLATELET
Basophils Absolute: 0 10*3/uL (ref 0.0–0.1)
Basophils Relative: 0 % (ref 0–1)
EOS ABS: 0.2 10*3/uL (ref 0.0–0.7)
EOS PCT: 2 % (ref 0–5)
HCT: 37 % (ref 36.0–46.0)
Hemoglobin: 12.3 g/dL (ref 12.0–15.0)
LYMPHS ABS: 2.4 10*3/uL (ref 0.7–4.0)
Lymphocytes Relative: 31 % (ref 12–46)
MCH: 30.3 pg (ref 26.0–34.0)
MCHC: 33.2 g/dL (ref 30.0–36.0)
MCV: 91.1 fL (ref 78.0–100.0)
MONOS PCT: 7 % (ref 3–12)
MPV: 10.1 fL (ref 8.6–12.4)
Monocytes Absolute: 0.5 10*3/uL (ref 0.1–1.0)
Neutro Abs: 4.6 10*3/uL (ref 1.7–7.7)
Neutrophils Relative %: 60 % (ref 43–77)
Platelets: 300 10*3/uL (ref 150–400)
RBC: 4.06 MIL/uL (ref 3.87–5.11)
RDW: 13.3 % (ref 11.5–15.5)
WBC: 7.6 10*3/uL (ref 4.0–10.5)

## 2015-08-28 LAB — COMPREHENSIVE METABOLIC PANEL
ALT: 13 U/L (ref 6–29)
AST: 14 U/L (ref 10–35)
Albumin: 3.8 g/dL (ref 3.6–5.1)
Alkaline Phosphatase: 85 U/L (ref 33–130)
BUN: 18 mg/dL (ref 7–25)
CHLORIDE: 103 mmol/L (ref 98–110)
CO2: 24 mmol/L (ref 20–31)
Calcium: 9.4 mg/dL (ref 8.6–10.4)
Creat: 1.2 mg/dL — ABNORMAL HIGH (ref 0.50–1.05)
GLUCOSE: 92 mg/dL (ref 65–99)
POTASSIUM: 4.1 mmol/L (ref 3.5–5.3)
Sodium: 138 mmol/L (ref 135–146)
Total Bilirubin: 0.5 mg/dL (ref 0.2–1.2)
Total Protein: 8 g/dL (ref 6.1–8.1)

## 2015-08-28 LAB — LIPID PANEL
Cholesterol: 202 mg/dL — ABNORMAL HIGH (ref 125–200)
HDL: 62 mg/dL (ref >=46)
LDL Cholesterol: 125 mg/dL (ref <130)
Total CHOL/HDL Ratio: 3.3 Ratio (ref <=5.0)
Triglycerides: 74 mg/dL (ref <150)
VLDL: 15 mg/dL (ref <30)

## 2015-08-28 LAB — POCT URINALYSIS DIPSTICK
Bilirubin, UA: NEGATIVE
GLUCOSE UA: NEGATIVE
Ketones, UA: NEGATIVE
Leukocytes, UA: NEGATIVE
NITRITE UA: NEGATIVE
Protein, UA: NEGATIVE
RBC UA: NEGATIVE
Spec Grav, UA: 1.015
UROBILINOGEN UA: NEGATIVE
pH, UA: 5

## 2015-08-28 LAB — TSH: TSH: 1.04 m[IU]/L

## 2015-08-28 MED ORDER — PHENTERMINE HCL 37.5 MG PO CAPS: 37.5000 mg | ORAL_CAPSULE | ORAL | Status: DC

## 2015-08-28 NOTE — Patient Instructions (Addendum)
Take Phentermine as directed for weight loss. Diet and exercise are important. RTC in one year or as needed. Stop regular sodas.

## 2015-08-29 DIAGNOSIS — R7302 Impaired glucose tolerance (oral): Secondary | ICD-10-CM | POA: Insufficient documentation

## 2015-08-29 LAB — VITAMIN D 25 HYDROXY (VIT D DEFICIENCY, FRACTURES): Vit D, 25-Hydroxy: 43 ng/mL (ref 30–100)

## 2015-08-29 NOTE — Progress Notes (Signed)
   Subjective:    Patient ID: Allison Shields, female    DOB: [birth year], 58 y.o.   MRN: [id number]  HPI 60 year old Black Female in today for health maintenance exam and evaluation of medical problems. Impaired glucose tolerance controlled by diet. History of hyperlipidemia treated with Zocor. History of metabolic syndrome, obesity, dependent edema. She takes diuretic for dependent edema. History of glaucoma followed [facility] regularly. History of vitamin D deficiency.  No known drug allergies.  Past medical history: Bilateral tubal ligation 1999. Glaucoma surgery right eye 2008.  History of trichomonads treated August 2006.  Tetanus immunization 2010. Gets annual flu vaccine through employment at [employer].  Social history: She has a high school education. She is separated from her husband. She has 2 sons. Youngest son who resides with her attends [employer] and his girlfriend is expecting a baby. She works at [employer] in [employer] on [address]  does not smoke or consume alcohol. Her mother is [name] who is also a patient here.  Family history: Father died of complications of a stroke with history of diabetes. Mother with history of hyperlipidemia, glaucoma, hypertension. One brother alive and well. One sister died of GI malignancy. One sister with remote history of sarcoidosis. Another sister with morbid obesity. Total of 7 sisters.  Last year, physical exam, she had issues with urticaria but that resolved with antihistamine treatment.        Review of Systems  Constitutional: Negative.   All other systems reviewed and are negative.      Objective:   Physical Exam  Constitutional: She is oriented to person, place, and time. She appears well-developed and well-nourished. No distress.  HENT:  Head: Normocephalic and atraumatic.  Right Ear: External ear normal.  Left Ear: External ear normal.  Mouth/Throat:  Oropharynx is clear and moist. No oropharyngeal exudate.  Eyes: Conjunctivae and EOM are normal. Pupils are equal, round, and reactive to light. Right eye exhibits no discharge. Left eye exhibits no discharge. No scleral icterus.  Neck: Neck supple. No JVD present. No thyromegaly present.  Cardiovascular: Normal rate, regular rhythm, normal heart sounds and intact distal pulses.   No murmur heard. Pulmonary/Chest: Effort normal and breath sounds normal. She has no wheezes. She has no rales.  Abdominal: Soft. Bowel sounds are normal. She exhibits no distension and no mass. There is no tenderness. There is no rebound.  Genitourinary:  Pap deferred. Bimanual normal. Pap was done in 2015.  Musculoskeletal: She exhibits no edema.  Neurological: She is alert and oriented to person, place, and time. She has normal reflexes. No cranial nerve deficit. Coordination normal.  Skin: Skin is warm and dry. No rash noted. She is not diaphoretic.  Psychiatric: She has a normal mood and affect. Her behavior is normal. Judgment and thought content normal.  Vitals reviewed.         Assessment & Plan:  Normal health maintenance exam  Recommend annual mammogram  Vitamin D deficiency-vitamin D level is normal  Obesity-encouraged diet and exercise and calorie restriction. Talk with her about portions. Prescribed phentermine 37.5 mg #100 one by mouth daily with no refill-- she usually gets one prescription for year  Dependent edema-treated with diuretic  Metabolic syndrome  Impaired glucose tolerance-hemoglobin A1c 6.2% and last year was 6.4%  Hyperlipidemia-treated with statin. Lipid panel within normal limits  Plan:

## 2015-09-01 ENCOUNTER — Telehealth: Payer: Self-pay

## 2015-09-01 NOTE — Telephone Encounter (Signed)
Left message advising patient to contact office. Per Dr. Renold Genta cannot see in patients chart where patient has ever had colonoscopy. I need to know if she has and if not if she will consider having cologuard?

## 2015-09-11 DIAGNOSIS — H4031X3 Glaucoma secondary to eye trauma, right eye, severe stage: Secondary | ICD-10-CM | POA: Diagnosis not present

## 2015-10-16 ENCOUNTER — Other Ambulatory Visit: Payer: Self-pay | Admitting: Internal Medicine

## 2015-10-16 MED FILL — TRIAMTERENE/HCTZ 75/50 TAB: 75-50 | 90 days supply | Qty: 90 | Fill #0

## 2015-10-16 MED FILL — SIMVASTATIN 10 MG TABLET: 10 | 90 days supply | Qty: 90 | Fill #2

## 2015-11-06 ENCOUNTER — Other Ambulatory Visit: Payer: Self-pay

## 2015-11-06 DIAGNOSIS — Z1231 Encounter for screening mammogram for malignant neoplasm of breast: Secondary | ICD-10-CM

## 2016-01-19 ENCOUNTER — Other Ambulatory Visit: Payer: Self-pay | Admitting: Internal Medicine

## 2016-01-19 ENCOUNTER — Telehealth: Payer: Self-pay | Admitting: Internal Medicine

## 2016-01-19 DIAGNOSIS — Z1231 Encounter for screening mammogram for malignant neoplasm of breast: Secondary | ICD-10-CM

## 2016-01-19 NOTE — Telephone Encounter (Signed)
Ms. Engeman called asking if she needs a six month or one year follow-up appointment with Dr. Renold Genta. I looked in her chart at the notes from her appointment in March 2017 but didn't see anything noted. Please give Ms. Deiss a phone call regarding this.  Pt's ph# [phone] Thank you.

## 2016-01-20 NOTE — Telephone Encounter (Signed)
Attempted to reach patient on her mobile # today; no answer and no voicemail.  Mailing patient an appointment card.  Per her last visit note with Dr. Renold Genta, patient is to follow up in one year for her annual exam or as needed.  Appointment made for 09/02/16 for fasting labs and physical exam.  Mailed to patient's home address:  Allison Shields [address] [place], [place].  [place]

## 2016-01-26 MED FILL — SIMVASTATIN 10 MG TABLET: 10 | 90 days supply | Qty: 90 | Fill #3

## 2016-01-26 MED FILL — TRIAMTERENE/HCTZ 75/50 TAB: 75-50 | 90 days supply | Qty: 90 | Fill #1

## 2016-02-05 ENCOUNTER — Ambulatory Visit
Admission: RE | Admit: 2016-02-05 | Discharge: 2016-02-05 | Disposition: A | Discharge status: Home / Self Care | Payer: 59 | Source: Ambulatory Visit | Attending: Internal Medicine | Admitting: Internal Medicine

## 2016-02-05 DIAGNOSIS — Z1231 Encounter for screening mammogram for malignant neoplasm of breast: Secondary | ICD-10-CM

## 2016-04-08 DIAGNOSIS — H25011 Cortical age-related cataract, right eye: Secondary | ICD-10-CM | POA: Diagnosis not present

## 2016-04-08 DIAGNOSIS — H4031X3 Glaucoma secondary to eye trauma, right eye, severe stage: Secondary | ICD-10-CM | POA: Diagnosis not present

## 2016-05-04 ENCOUNTER — Other Ambulatory Visit: Payer: Self-pay | Admitting: Internal Medicine

## 2016-05-09 ENCOUNTER — Other Ambulatory Visit: Payer: Self-pay | Admitting: Internal Medicine

## 2016-05-09 MED FILL — SIMVASTATIN 10 MG TABLET: 10 | 90 days supply | Qty: 90 | Fill #0

## 2016-06-30 ENCOUNTER — Other Ambulatory Visit: Payer: Self-pay | Admitting: Internal Medicine

## 2016-06-30 MED FILL — TRIAMTERENE-HCTZ 75-50 MG T: 75-50 | 90 days supply | Qty: 90 | Fill #0

## 2016-08-10 ENCOUNTER — Other Ambulatory Visit: Payer: Self-pay | Admitting: Internal Medicine

## 2016-08-10 MED FILL — SIMVASTATIN 10 MG TABLET: 10 | 90 days supply | Qty: 90 | Fill #0

## 2016-09-02 ENCOUNTER — Encounter: Payer: 59 | Admitting: Internal Medicine

## 2016-10-14 DIAGNOSIS — H40012 Open angle with borderline findings, low risk, left eye: Secondary | ICD-10-CM | POA: Diagnosis not present

## 2016-10-14 DIAGNOSIS — H4031X3 Glaucoma secondary to eye trauma, right eye, severe stage: Secondary | ICD-10-CM | POA: Diagnosis not present

## 2016-10-18 ENCOUNTER — Other Ambulatory Visit: Payer: Self-pay | Admitting: Internal Medicine

## 2016-10-18 MED FILL — TRIAMTERENE-HCTZ 75-50 MG T: 75-50 | 90 days supply | Qty: 90 | Fill #0

## 2016-11-04 ENCOUNTER — Encounter: Payer: Self-pay | Admitting: Internal Medicine

## 2016-11-04 ENCOUNTER — Ambulatory Visit (INDEPENDENT_AMBULATORY_CARE_PROVIDER_SITE_OTHER): Payer: 59 | Admitting: Internal Medicine

## 2016-11-04 ENCOUNTER — Other Ambulatory Visit (HOSPITAL_COMMUNITY)
Admission: RE | Admit: 2016-11-04 | Discharge: 2016-11-04 | Disposition: A | Discharge status: Home / Self Care | Payer: 59 | Source: Ambulatory Visit | Attending: Internal Medicine | Admitting: Internal Medicine

## 2016-11-04 VITALS — BP 102/76 | HR 69 | Temp 97.5°F | Ht 63.0 in | Wt 205.0 lb

## 2016-11-04 DIAGNOSIS — E785 Hyperlipidemia, unspecified: Secondary | ICD-10-CM

## 2016-11-04 DIAGNOSIS — Z130 Encounter for screening for diseases of the blood and blood-forming organs and certain disorders involving the immune mechanism: Secondary | ICD-10-CM | POA: Diagnosis not present

## 2016-11-04 DIAGNOSIS — E559 Vitamin D deficiency, unspecified: Secondary | ICD-10-CM | POA: Diagnosis not present

## 2016-11-04 DIAGNOSIS — Z13220 Encounter for screening for lipoid disorders: Secondary | ICD-10-CM | POA: Diagnosis not present

## 2016-11-04 DIAGNOSIS — Z1211 Encounter for screening for malignant neoplasm of colon: Secondary | ICD-10-CM

## 2016-11-04 DIAGNOSIS — R7302 Impaired glucose tolerance (oral): Secondary | ICD-10-CM | POA: Diagnosis not present

## 2016-11-04 DIAGNOSIS — E669 Obesity, unspecified: Secondary | ICD-10-CM

## 2016-11-04 DIAGNOSIS — Z01419 Encounter for gynecological examination (general) (routine) without abnormal findings: Secondary | ICD-10-CM | POA: Insufficient documentation

## 2016-11-04 DIAGNOSIS — H409 Unspecified glaucoma: Secondary | ICD-10-CM | POA: Diagnosis not present

## 2016-11-04 DIAGNOSIS — Z0000 Encounter for general adult medical examination without abnormal findings: Secondary | ICD-10-CM | POA: Diagnosis not present

## 2016-11-04 DIAGNOSIS — R5383 Other fatigue: Secondary | ICD-10-CM | POA: Diagnosis not present

## 2016-11-04 DIAGNOSIS — E8881 Metabolic syndrome: Secondary | ICD-10-CM | POA: Diagnosis not present

## 2016-11-04 DIAGNOSIS — M19011 Primary osteoarthritis, right shoulder: Secondary | ICD-10-CM | POA: Diagnosis not present

## 2016-11-04 LAB — CBC
HCT: 38.2 % (ref 35.0–45.0)
HEMOGLOBIN: 12.2 g/dL (ref 11.7–15.5)
MCH: 29.6 pg (ref 27.0–33.0)
MCHC: 31.9 g/dL — ABNORMAL LOW (ref 32.0–36.0)
MCV: 92.7 fL (ref 80.0–100.0)
MPV: 10.1 fL (ref 7.5–12.5)
Platelets: 313 10*3/uL (ref 140–400)
RBC: 4.12 MIL/uL (ref 3.80–5.10)
RDW: 13.6 % (ref 11.0–15.0)
WBC: 6.5 10*3/uL (ref 3.8–10.8)

## 2016-11-04 LAB — TSH: TSH: 0.96 mIU/L

## 2016-11-04 LAB — POCT URINALYSIS DIPSTICK
BILIRUBIN UA: NEGATIVE
Glucose, UA: NEGATIVE
KETONES UA: NEGATIVE
LEUKOCYTES UA: NEGATIVE
Nitrite, UA: NEGATIVE
Protein, UA: NEGATIVE
RBC UA: NEGATIVE
Spec Grav, UA: 1.01 (ref 1.010–1.025)
Urobilinogen, UA: 0.2 E.U./dL
pH, UA: 6 (ref 5.0–8.0)

## 2016-11-04 MED ORDER — PHENTERMINE HCL 37.5 MG PO CAPS: 37.5000 mg | ORAL_CAPSULE | ORAL | 0 refills | Status: DC

## 2016-11-04 MED ORDER — SIMVASTATIN 10 MG PO TABS: 10.0000 mg | ORAL_TABLET | Freq: Every day | ORAL | 1 refills | Status: DC

## 2016-11-04 MED ORDER — TRIAMTERENE-HCTZ 75-50 MG PO TABS: 1.0000 | ORAL_TABLET | Freq: Every day | ORAL | 3 refills | Status: DC

## 2016-11-04 MED FILL — SIMVASTATIN 10 MG TABLET: 10 | 90 days supply | Qty: 90 | Fill #0

## 2016-11-04 NOTE — Progress Notes (Signed)
Subjective:    Patient ID: Allison Shields, female    DOB: Mar 23, 1956, 61 y.o.   MRN: [id number]  HPI 61 year old Black  Female in today for health maintenance exam and evaluation of medical issues. Patient says she has not had colonoscopy so referral will be made.She has a history of hyperlipidemia. Fasting lab work drawn today and is pending. History of obesity. Usually gets 90 phentermine tablets yearly but doesn't usually make much progress or weight loss.  Hyperlipidemia treated with Zocor. History of dependent edema for which she takes diuretic. History of glaucoma.  In 2016 her hemoglobin A1c was 6.4% but she has been able to keep this under control with diet and exercise. At that time she weighed 204 pounds.   She has issues with right shoulder pain but this has improved with time. Good range of motion today.   Past medical history: History of glaucoma followed by [facility]. Had right eye surgery for glaucoma 2008.  History of trichomonads treated August 2006.  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 Does not smoke or consume alcohol. Her mother is Allison Shields who is also a patient here. She has a high school education. She is separated from her husband. She has 2 sons.  She has had urticaria in the past but that resolved with antihistamine.  Family history: Father died of, occasions of a stroke with history of diabetes. Mother with history of hypertension, glaucoma, hyperlipidemia, rheumatoid arthritis. One brother who is alive and well. One sister died of GI malignancy. One sister with remote history of sarcoidosis. Another sister with morbid obesity. Total of 7 sisters.        Review of Systems no new complaints     Objective:   Physical Exam  Constitutional: She is oriented to person, place, and time. She appears well-developed and well-nourished. No distress.  HENT:  Head: Normocephalic and atraumatic.    Right Ear: External ear normal.  Left Ear: External ear normal.  Mouth/Throat: Oropharynx is clear and moist. No oropharyngeal exudate.  Eyes: Conjunctivae and EOM are normal. Pupils are equal, round, and reactive to light. Right eye exhibits no discharge. Left eye exhibits no discharge. No scleral icterus.  Neck: Neck supple. No JVD present. No tracheal deviation present. No thyromegaly present.  Cardiovascular: Normal rate, regular rhythm, normal heart sounds and intact distal pulses.   No murmur heard. Pulmonary/Chest: Effort normal and breath sounds normal. No respiratory distress. She has no wheezes. She has no rales.  Breasts normal female  Abdominal: Soft. Bowel sounds are normal. She exhibits no distension and no mass. There is no tenderness. There is no rebound and no guarding.  Genitourinary:  Genitourinary Comments: Pap taken. Bimanual normal.  Musculoskeletal: She exhibits no edema.  Lymphadenopathy:    She has no cervical adenopathy.  Neurological: She is alert and oriented to person, place, and time. She has normal reflexes. No cranial nerve deficit. Coordination normal.  Skin: Skin is warm and dry. No rash noted. She is not diaphoretic.  Psychiatric: She has a normal mood and affect. Her behavior is normal. Judgment and thought content normal.  Vitals reviewed.         Assessment & Plan:  History of right shoulder arthropathy-improved  Glaucoma-seen regularly by ophthalmologist  Obesity-prescribed phentermine 37.5 mg #90 capsules 1 by mouth daily with no refill.  Dependent edema treated with diuretic  History of vitamin D deficiency-vitamin D level is normal at 44  History of impaired glucose tolerance-hemoglobin A1c stable at 6.1%. Continue diet and exercise.  Hyperlipidemia-stable on statin therapy  Plan: Patient needs to have colonoscopy in the near future. Recommend annual mammogram.

## 2016-11-05 LAB — COMPLETE METABOLIC PANEL WITH GFR
ALBUMIN: 3.6 g/dL (ref 3.6–5.1)
ALT: 14 U/L (ref 6–29)
AST: 16 U/L (ref 10–35)
Alkaline Phosphatase: 90 U/L (ref 33–130)
BILIRUBIN TOTAL: 0.5 mg/dL (ref 0.2–1.2)
BUN: 20 mg/dL (ref 7–25)
CALCIUM: 9.1 mg/dL (ref 8.6–10.4)
CHLORIDE: 98 mmol/L (ref 98–110)
CO2: 27 mmol/L (ref 20–31)
Creat: 1.02 mg/dL (ref 0.50–1.05)
GFR, EST AFRICAN AMERICAN: 70 mL/min (ref >=60)
GFR, Est Non African American: 61 mL/min (ref >=60)
Glucose, Bld: 91 mg/dL (ref 65–99)
Potassium: 3.7 mmol/L (ref 3.5–5.3)
Sodium: 136 mmol/L (ref 135–146)
TOTAL PROTEIN: 7.6 g/dL (ref 6.1–8.1)

## 2016-11-05 LAB — HEMOGLOBIN A1C
HEMOGLOBIN A1C: 6.1 % — AB (ref <5.7)
MEAN PLASMA GLUCOSE: 128 mg/dL

## 2016-11-05 LAB — LIPID PANEL
Cholesterol: 163 mg/dL (ref <200)
HDL: 54 mg/dL (ref >50)
LDL Cholesterol: 95 mg/dL (ref <100)
Total CHOL/HDL Ratio: 3 Ratio (ref <5.0)
Triglycerides: 72 mg/dL (ref <150)
VLDL: 14 mg/dL (ref <30)

## 2016-11-05 LAB — VITAMIN D 25 HYDROXY (VIT D DEFICIENCY, FRACTURES): VIT D 25 HYDROXY: 44 ng/mL (ref 30–100)

## 2016-11-05 LAB — MICROALBUMIN / CREATININE URINE RATIO
Creatinine, Urine: 167 mg/dL (ref 20–320)
MICROALB UR: 0.4 mg/dL
Microalb Creat Ratio: 2 mcg/mg creat (ref <30)

## 2016-11-06 NOTE — Patient Instructions (Addendum)
Phentermine prescribed as patient requested. Continue diet and exercise regimen. Continue statin medication and diuretic. Return in one year or as needed. Continue to watch calories. Hemoglobin A1c stable at 6.1%. Need to have colonoscopy.

## 2016-11-09 LAB — CYTOLOGY - PAP: DIAGNOSIS: NEGATIVE

## 2016-11-11 ENCOUNTER — Ambulatory Visit (INDEPENDENT_AMBULATORY_CARE_PROVIDER_SITE_OTHER): Payer: 59 | Admitting: Internal Medicine

## 2016-11-11 ENCOUNTER — Encounter: Payer: Self-pay | Admitting: Internal Medicine

## 2016-11-11 VITALS — BP 120/72 | HR 83 | Temp 97.3°F | Wt 206.0 lb

## 2016-11-11 DIAGNOSIS — A599 Trichomoniasis, unspecified: Secondary | ICD-10-CM | POA: Diagnosis not present

## 2016-11-11 MED ORDER — METRONIDAZOLE 500 MG PO TABS: 500.0000 mg | ORAL_TABLET | Freq: Two times a day (BID) | ORAL | 0 refills | Status: DC

## 2016-11-11 MED FILL — metroNIDAZOLE 500 MG TABS: 500 | 7 days supply | Qty: 14 | Fill #0

## 2016-11-11 NOTE — Progress Notes (Signed)
   Subjective:    Patient ID: Allison Shields, female    DOB: 03/11/1956, 61 y.o.   MRN: [id number]  HPI  Patient was found to have trichomonads on recent Pap smear. Only one recent sexual partner. No symptoms. Trichomonads were present in 2015 but we did not treat her. Also were present in 2012.    Review of Systems     Objective:   Physical Exam  Not examined- Spent 15 minutes speaking with her about how trichomonads were required. She's honestly had them for a number of years and not from current partner. Decided to go ahead and treat her. Otherwise her Pap smear is normal.      Assessment & Plan:  Trichomoniasis  Plan: Flagyl 500 mg twice daily for 7 days followed by a vinegar and water douche.

## 2016-11-13 NOTE — Patient Instructions (Signed)
Flagyl 500 mg twice daily for 7 days. Do not drink alcohol while consuming Flagyl. Use a vinegar and water douche after course of Flagyl is completed.

## 2016-12-19 ENCOUNTER — Ambulatory Visit (AMBULATORY_SURGERY_CENTER): Payer: Self-pay

## 2016-12-19 VITALS — Ht 63.0 in | Wt 207.0 lb

## 2016-12-19 DIAGNOSIS — Z1211 Encounter for screening for malignant neoplasm of colon: Secondary | ICD-10-CM

## 2016-12-19 MED ORDER — NA SULFATE-K SULFATE-MG SULF 17.5-3.13-1.6 GM/177ML PO SOLN: 1.0000 | Freq: Once | ORAL | 0 refills | Status: AC

## 2016-12-19 MED FILL — SUPREP BOWEL PREP KIT: 17.5-3.13-1 | 2 days supply | Qty: 354 | Fill #0

## 2016-12-19 NOTE — Progress Notes (Signed)
Denies allergies to eggs or soy products. Denies complication of anesthesia or sedation. Denies use of weight loss medication. Denies use of O2.   Emmi instructions declined patient does not have email.   Patient has a prescription for PHENTERMINE but patient states that she has not started taking them.  Patient was informed that she can not take them within 10 days of procedure due to drug interactions.  Patient verbalizes that she will not take them until after her colonoscopy on 01/02/17.  Patient states that she understands the importance of not taking the medication.

## 2016-12-20 ENCOUNTER — Encounter: Payer: Self-pay | Admitting: Internal Medicine

## 2017-01-02 ENCOUNTER — Ambulatory Visit (AMBULATORY_SURGERY_CENTER): Payer: 59 | Admitting: Internal Medicine

## 2017-01-02 ENCOUNTER — Encounter: Payer: Self-pay | Admitting: Internal Medicine

## 2017-01-02 VITALS — BP 119/67 | HR 65 | Temp 96.8°F | Resp 12 | Ht 63.0 in | Wt 207.0 lb

## 2017-01-02 DIAGNOSIS — D125 Benign neoplasm of sigmoid colon: Secondary | ICD-10-CM | POA: Diagnosis not present

## 2017-01-02 DIAGNOSIS — Z1212 Encounter for screening for malignant neoplasm of rectum: Secondary | ICD-10-CM | POA: Diagnosis not present

## 2017-01-02 DIAGNOSIS — K635 Polyp of colon: Secondary | ICD-10-CM

## 2017-01-02 DIAGNOSIS — K6389 Other specified diseases of intestine: Secondary | ICD-10-CM | POA: Diagnosis not present

## 2017-01-02 DIAGNOSIS — Z1211 Encounter for screening for malignant neoplasm of colon: Secondary | ICD-10-CM

## 2017-01-02 DIAGNOSIS — E119 Type 2 diabetes mellitus without complications: Secondary | ICD-10-CM | POA: Diagnosis not present

## 2017-01-02 DIAGNOSIS — I10 Essential (primary) hypertension: Secondary | ICD-10-CM | POA: Diagnosis not present

## 2017-01-02 DIAGNOSIS — D123 Benign neoplasm of transverse colon: Secondary | ICD-10-CM

## 2017-01-02 MED ORDER — SODIUM CHLORIDE 0.9 % IV SOLN: 500.0000 mL | INTRAVENOUS | Status: DC

## 2017-01-02 NOTE — Op Note (Signed)
[name] Procedure Date: 01/02/2017 1:54 PM MRN: [id number] Endoscopist: Docia Chuck. Henrene Pastor , MD Age: 61 Referring MD:  Date of Birth: 1956-01-16 Gender: Female Account #: [id number] Procedure:                Colonoscopy, with cold snare polypectomy x 2 Indications:              Screening for colorectal malignant neoplasm Medicines:                Monitored Anesthesia Care Procedure:                Pre-Anesthesia Assessment:                           - Prior to the procedure, a History and Physical                            was performed, and patient medications and                            allergies were reviewed. The patient's tolerance of                            previous anesthesia was also reviewed. The risks                            and benefits of the procedure and the sedation                            options and risks were discussed with the patient.                            All questions were answered, and informed consent                            was obtained. Prior Anticoagulants: The patient has                            taken no previous anticoagulant or antiplatelet                            agents. ASA Grade Assessment: II - A patient with                            mild systemic disease. After reviewing the risks                            and benefits, the patient was deemed in                            satisfactory condition to undergo the procedure.                           After obtaining informed consent, the colonoscope  was passed under direct vision. Throughout the                            procedure, the patient's blood pressure, pulse, and                            oxygen saturations were monitored continuously. The                            Colonoscope was introduced through the anus and                            advanced to the the cecum, identified by      appendiceal orifice and ileocecal valve. The                            ileocecal valve, appendiceal orifice, and rectum                            were photographed. The quality of the bowel                            preparation was excellent. The colonoscopy was                            performed without difficulty. The patient tolerated                            the procedure well. The bowel preparation used was                            SUPREP. Scope In: 2:01:35 PM Scope Out: 2:16:20 PM Scope Withdrawal Time: 0 hours 12 minutes 28 seconds  Total Procedure Duration: 0 hours 14 minutes 45 seconds  Findings:                 Two polyps were found in the sigmoid colon and                            transverse colon. The polyps were 3 mm in size.                            These polyps were removed with a cold snare.                            Resection and retrieval were complete.                           A single medium-mouthed diverticulum was found in                            the cecum.                           Internal hemorrhoids were found during retroflexion.  The exam was otherwise without abnormality on                            direct and retroflexion views. Complications:            No immediate complications. Estimated blood loss:                            None. Estimated Blood Loss:     Estimated blood loss: none. Impression:               - Two 3 mm polyps in the sigmoid colon and in the                            transverse colon, removed with a cold snare.                            Resected and retrieved.                           - Diverticulosis in the cecum.                           - Internal hemorrhoids.                           - The examination was otherwise normal on direct                            and retroflexion views. Recommendation:           - Repeat colonoscopy in 5-10 years for surveillance.                            - Patient has a contact number available for                            emergencies. The signs and symptoms of potential                            delayed complications were discussed with the                            patient. Return to normal activities tomorrow.                            Written discharge instructions were provided to the                            patient.                           - Resume previous diet.                           - Continue present medications.                           -  Await pathology results. Docia Chuck. Henrene Pastor, MD 01/02/2017 2:19:33 PM This report has been signed electronically.

## 2017-01-02 NOTE — Progress Notes (Signed)
Called to room to assist during endoscopic procedure.  Patient ID and intended procedure confirmed with present staff. Received instructions for my participation in the procedure from the performing physician.  

## 2017-01-02 NOTE — Patient Instructions (Signed)
YOU HAD AN ENDOSCOPIC PROCEDURE TODAY AT THE [facility]:   Refer to the procedure report that was given to you for any specific questions about what was found during the examination.  If the procedure report does not answer your questions, please call your gastroenterologist to clarify.  If you requested that your care partner not be given the details of your procedure findings, then the procedure report has been included in a sealed envelope for you to review at your convenience later.  YOU SHOULD EXPECT: Some feelings of bloating in the abdomen. Passage of more gas than usual.  Walking can help get rid of the air that was put into your GI tract during the procedure and reduce the bloating. If you had a lower endoscopy (such as a colonoscopy or flexible sigmoidoscopy) you may notice spotting of blood in your stool or on the toilet paper. If you underwent a bowel prep for your procedure, you may not have a normal bowel movement for a few days.  Please Note:  You might notice some irritation and congestion in your nose or some drainage.  This is from the oxygen used during your procedure.  There is no need for concern and it should clear up in a day or so.  SYMPTOMS TO REPORT IMMEDIATELY:   Following lower endoscopy (colonoscopy or flexible sigmoidoscopy):  Excessive amounts of blood in the stool  Significant tenderness or worsening of abdominal pains  Swelling of the abdomen that is new, acute  Fever of 100F or higher   For urgent or emergent issues, a gastroenterologist can be reached at any hour by calling [phone].   DIET:  We do recommend a small meal at first, but then you may proceed to your regular diet.  Drink plenty of fluids but you should avoid alcoholic beverages for 24 hours.  ACTIVITY:  You should plan to take it easy for the rest of today and you should NOT DRIVE or use heavy machinery until tomorrow (because of the sedation medicines used during the test).     FOLLOW UP: Our staff will call the number listed on your records the next business day following your procedure to check on you and address any questions or concerns that you may have regarding the information given to you following your procedure. If we do not reach you, we will leave a message.  However, if you are feeling well and you are not experiencing any problems, there is no need to return our call.  We will assume that you have returned to your regular daily activities without incident.  If any biopsies were taken you will be contacted by phone or by letter within the next 1-3 weeks.  Please call us at [phone] if you have not heard about the biopsies in 3 weeks.    SIGNATURES/CONFIDENTIALITY: You and/or your care partner have signed paperwork which will be entered into your electronic medical record.  These signatures attest to the fact that that the information above on your After Visit Summary has been reviewed and is understood.  Full responsibility of the confidentiality of this discharge information lies with you and/or your care-partner.    Handouts were given to your care partner on polyps, diverticulosis, hemorrhoids, and a high fiber diet with liberal fluid intake. You may resume your current medications today. Await biopsy results. Please call if any questions or concerns.    

## 2017-01-02 NOTE — Progress Notes (Signed)
Report to PACU, RN, vss, BBS= Clear.  

## 2017-01-02 NOTE — Progress Notes (Signed)
Not pt's son, but pt's nephew. maw

## 2017-01-02 NOTE — Progress Notes (Signed)
Pt's mother called her son to come to pick them up when she got into the recovery room.  Maw  No problems noted in the recovery room. maw

## 2017-01-02 NOTE — Progress Notes (Signed)
Pt's states no medical or surgical changes since previsit or office visit. 

## 2017-01-03 ENCOUNTER — Telehealth: Payer: Self-pay | Admitting: *Deleted

## 2017-01-03 NOTE — Telephone Encounter (Signed)
  Follow up Call-  Call back number 01/02/2017 01/02/2017  Post procedure Call Back phone  # [phone] Christen Bame - pt's son [phone]  Permission to leave phone message - Yes  Some recent data might be hidden     Patient questions:  Do you have a fever, pain , or abdominal swelling? No. Pain Score  0 *  Have you tolerated food without any problems? Yes.    Have you been able to return to your normal activities? Yes.    Do you have any questions about your discharge instructions: Diet   No. Medications  No. Follow up visit  No.  Do you have questions or concerns about your Care? No.  Actions: * If pain score is 4 or above: No action needed, pain <4.

## 2017-01-05 ENCOUNTER — Encounter: Payer: Self-pay | Admitting: Internal Medicine

## 2017-01-19 MED FILL — SIMVASTATIN 10 MG TAB: 10 | 90 days supply | Qty: 90 | Fill #0

## 2017-01-19 MED FILL — TRIAMTERENE-HCTZ 75-50 MG T: 75-50 | 90 days supply | Qty: 90 | Fill #0

## 2017-01-30 ENCOUNTER — Other Ambulatory Visit: Payer: Self-pay | Admitting: Internal Medicine

## 2017-01-30 DIAGNOSIS — Z1231 Encounter for screening mammogram for malignant neoplasm of breast: Secondary | ICD-10-CM

## 2017-02-10 ENCOUNTER — Ambulatory Visit
Admission: RE | Admit: 2017-02-10 | Discharge: 2017-02-10 | Disposition: A | Discharge status: Home / Self Care | Payer: 59 | Source: Ambulatory Visit | Attending: Internal Medicine | Admitting: Internal Medicine

## 2017-02-10 DIAGNOSIS — Z1231 Encounter for screening mammogram for malignant neoplasm of breast: Secondary | ICD-10-CM

## 2017-04-17 MED FILL — TRIAMTERENE-HCTZ 75-50 MG T: 75-50 | 90 days supply | Qty: 90 | Fill #1

## 2017-04-17 MED FILL — SIMVASTATIN 10 MG TAB: 10 | 90 days supply | Qty: 90 | Fill #1

## 2017-04-28 DIAGNOSIS — H25011 Cortical age-related cataract, right eye: Secondary | ICD-10-CM | POA: Diagnosis not present

## 2017-04-28 DIAGNOSIS — H4031X3 Glaucoma secondary to eye trauma, right eye, severe stage: Secondary | ICD-10-CM | POA: Diagnosis not present

## 2017-04-28 DIAGNOSIS — H2511 Age-related nuclear cataract, right eye: Secondary | ICD-10-CM | POA: Diagnosis not present

## 2017-07-26 MED FILL — SIMVASTATIN 10 MG TAB: 10 | 90 days supply | Qty: 90 | Fill #1

## 2017-07-26 MED FILL — TRIAMTERENE-HCTZ 75/50 TAB: 75-50 | 90 days supply | Qty: 90 | Fill #2

## 2017-10-27 DIAGNOSIS — H43811 Vitreous degeneration, right eye: Secondary | ICD-10-CM | POA: Diagnosis not present

## 2017-10-27 DIAGNOSIS — H4031X3 Glaucoma secondary to eye trauma, right eye, severe stage: Secondary | ICD-10-CM | POA: Diagnosis not present

## 2017-10-31 ENCOUNTER — Other Ambulatory Visit: Payer: Self-pay | Admitting: Internal Medicine

## 2017-10-31 MED FILL — TRIAMTERENE-HCTZ 75/50 TAB: 75-50 | 90 days supply | Qty: 90 | Fill #3

## 2017-10-31 MED FILL — SIMVASTATIN 10 MG TAB: 10 | 90 days supply | Qty: 90 | Fill #0

## 2017-11-17 ENCOUNTER — Encounter: Payer: Self-pay | Admitting: Internal Medicine

## 2017-11-17 ENCOUNTER — Other Ambulatory Visit: Payer: Self-pay | Admitting: Internal Medicine

## 2017-11-17 ENCOUNTER — Ambulatory Visit (INDEPENDENT_AMBULATORY_CARE_PROVIDER_SITE_OTHER): Payer: 59 | Admitting: Internal Medicine

## 2017-11-17 VITALS — BP 102/70 | HR 68 | Ht 63.0 in | Wt 211.0 lb

## 2017-11-17 DIAGNOSIS — H409 Unspecified glaucoma: Secondary | ICD-10-CM

## 2017-11-17 DIAGNOSIS — E8881 Metabolic syndrome: Secondary | ICD-10-CM

## 2017-11-17 DIAGNOSIS — Z8639 Personal history of other endocrine, nutritional and metabolic disease: Secondary | ICD-10-CM | POA: Diagnosis not present

## 2017-11-17 DIAGNOSIS — Z13220 Encounter for screening for lipoid disorders: Secondary | ICD-10-CM

## 2017-11-17 DIAGNOSIS — Z0000 Encounter for general adult medical examination without abnormal findings: Secondary | ICD-10-CM

## 2017-11-17 DIAGNOSIS — R7302 Impaired glucose tolerance (oral): Secondary | ICD-10-CM

## 2017-11-17 DIAGNOSIS — Z1329 Encounter for screening for other suspected endocrine disorder: Secondary | ICD-10-CM

## 2017-11-17 DIAGNOSIS — E669 Obesity, unspecified: Secondary | ICD-10-CM | POA: Diagnosis not present

## 2017-11-17 DIAGNOSIS — E7800 Pure hypercholesterolemia, unspecified: Secondary | ICD-10-CM

## 2017-11-17 LAB — POCT URINALYSIS DIPSTICK
APPEARANCE: NORMAL
BILIRUBIN UA: NEGATIVE
GLUCOSE UA: NEGATIVE
Ketones, UA: NEGATIVE
Leukocytes, UA: NEGATIVE
Nitrite, UA: NEGATIVE
Odor: NORMAL
Protein, UA: NEGATIVE
RBC UA: NEGATIVE
Spec Grav, UA: 1.015 (ref 1.010–1.025)
Urobilinogen, UA: 0.2 E.U./dL
pH, UA: 6.5 (ref 5.0–8.0)

## 2017-11-17 MED ORDER — PHENTERMINE HCL 37.5 MG PO CAPS: 37.5000 mg | ORAL_CAPSULE | ORAL | 0 refills | Status: DC

## 2017-11-17 NOTE — Progress Notes (Signed)
Subjective:    Patient ID: Allison Shields, female    DOB: [birth year], 62 y.o.   MRN: [id number]  HPI Pleasant 62 year old Black Female in today for health maintenance exam and evaluation of medical issues.  BMI is 37.38 and weight is 211 pounds last year she weighed 205 pounds.  I generally give her 90 days of phentermine to help her get jumpstarted with diet but it does not seem to really help very much.  Discussed diet exercise and weight loss today and perhaps referral to Dr. Migdalia Dk clinic.  She has a history of  dependent edema treated with diuretic, pure hypercholesterolemia dating back to at least 2012 on statin therapy, impaired glucose tolerance.  No history of hypertension.  In 2016 her hemoglobin A1c was 6.4% but she is been able to keep this under control with diet and exercise and does not want to be on medication such as metformin.  She has issues with right shoulder pain at times.  History of glaucoma followed by [facility] ophthalmology.  Had right eye surgery for glaucoma in 2008.  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  Does not smoke or consume alcohol.  She has a high school education.  She is separated from her husband.  She has 2 sons.  Her mother is [name] who is also a patient here.  She has had urticaria in the past that resolved with antihistamine.  Family history: Father died of complications of a stroke with history of diabetes.  Mother with history of hypertension, glaucoma, hyperlipidemia, rheumatoid arthritis.  One brother alive and well.  One sister died of GI malignancy.  One sister with remote history of sarcoidosis that resolved.  Another sister with morbid obesity.  Total of 7 sisters.  She had colonoscopy in 2018.  2 polyps removed.  One hyperplastic and one benign mucosa.  Prefers to be on high-dose vitamin D weekly.  Says she felt better when she was taking that rather than over-the-counter vitamin  D daily.  Had Pap in 2018.  History of trichomonads which have been treated.    Review of Systems  Constitutional: Negative.   All other systems reviewed and are negative.      Objective:   Physical Exam  Constitutional: She is oriented to person, place, and time. She appears well-developed and well-nourished.  HENT:  Head: Normocephalic.  Right Ear: External ear normal.  Left Ear: External ear normal.  Mouth/Throat: Oropharynx is clear and moist.  Eyes: Pupils are equal, round, and reactive to light. Conjunctivae and EOM are normal. Right eye exhibits no discharge. Left eye exhibits no discharge. No scleral icterus.  Neck: Normal range of motion. Neck supple. No JVD present. No thyromegaly present.  Cardiovascular: Normal rate, regular rhythm and normal heart sounds.  No murmur heard. Pulmonary/Chest: Effort normal and breath sounds normal. No stridor. No respiratory distress. She has no wheezes. She has no rales.  Breasts normal female without masses  Abdominal: Soft. Bowel sounds are normal. She exhibits no distension and no mass. There is no tenderness. There is no rebound and no guarding. No hernia.  Genitourinary:  Genitourinary Comments: Pap taken in 2018 and deferred today.  Bimanual normal  Musculoskeletal: Normal range of motion. She exhibits no edema.  Lymphadenopathy:    She has no cervical adenopathy.  Neurological: She is alert and oriented to person, place, and time. She displays normal reflexes. No cranial nerve deficit or sensory deficit. She  exhibits normal muscle tone. Coordination normal.  Skin: Skin is warm and dry.  Psychiatric: She has a normal mood and affect. Her behavior is normal. Thought content normal.  Vitals reviewed.         Assessment & Plan:  Normal health maintenance exam  Pure hypercholesterolemia treated with statin medication  Glaucoma treated by [facility] ophthalmology  History of right shoulder arthropathy-has pain from time  to time  Dependent edema treated with diuretic  History of vitamin D deficiency but vitamin D level is normal but she prefers to be on high-dose vitamin D weekly.  Obesity-prescribed phentermine 37.5 mg number 90 capsules 1 p.o. by mouth daily with no refill.  Would like for her to consider Dr. Migdalia Dk clinic.  History of impaired glucose tolerance with hemoglobin A1c stable at 6.2%.  She could benefit from metformin but does not want to be on that.  Plan: Return in 1 year or as needed.  Work on diet exercise and weight loss which would help her hemoglobin A1c.

## 2017-11-18 LAB — MICROALBUMIN / CREATININE URINE RATIO
Creatinine, Urine: 131 mg/dL (ref 20–275)
MICROALB UR: 0.3 mg/dL
MICROALB/CREAT RATIO: 2 ug/mg{creat} (ref <30)

## 2017-11-18 LAB — COMPLETE METABOLIC PANEL WITH GFR
AG Ratio: 0.9 (calc) — ABNORMAL LOW (ref 1.0–2.5)
ALKALINE PHOSPHATASE (APISO): 74 U/L (ref 33–130)
ALT: 11 U/L (ref 6–29)
AST: 14 U/L (ref 10–35)
Albumin: 3.7 g/dL (ref 3.6–5.1)
BUN: 15 mg/dL (ref 7–25)
CALCIUM: 9.3 mg/dL (ref 8.6–10.4)
CO2: 30 mmol/L (ref 20–32)
CREATININE: 1.05 mg/dL (ref 0.50–1.05)
Chloride: 101 mmol/L (ref 98–110)
GFR, Est African American: 67 mL/min/{1.73_m2} (ref >=60)
GFR, Est Non African American: 58 mL/min/{1.73_m2} — ABNORMAL LOW (ref >=60)
GLUCOSE: 90 mg/dL (ref 65–99)
Globulin: 4.1 g/dL (calc) — ABNORMAL HIGH (ref 1.9–3.7)
Potassium: 4 mmol/L (ref 3.5–5.3)
Sodium: 136 mmol/L (ref 135–146)
Total Bilirubin: 0.5 mg/dL (ref 0.2–1.2)
Total Protein: 7.8 g/dL (ref 6.1–8.1)

## 2017-11-18 LAB — CBC WITH DIFFERENTIAL/PLATELET
BASOS ABS: 28 {cells}/uL (ref 0–200)
Basophils Relative: 0.4 %
EOS ABS: 110 {cells}/uL (ref 15–500)
Eosinophils Relative: 1.6 %
HCT: 37.4 % (ref 35.0–45.0)
Hemoglobin: 12.5 g/dL (ref 11.7–15.5)
Lymphs Abs: 2470 cells/uL (ref 850–3900)
MCH: 29.9 pg (ref 27.0–33.0)
MCHC: 33.4 g/dL (ref 32.0–36.0)
MCV: 89.5 fL (ref 80.0–100.0)
MONOS PCT: 7.4 %
MPV: 10.6 fL (ref 7.5–12.5)
NEUTROS PCT: 54.8 %
Neutro Abs: 3781 cells/uL (ref 1500–7800)
PLATELETS: 275 10*3/uL (ref 140–400)
RBC: 4.18 10*6/uL (ref 3.80–5.10)
RDW: 12.2 % (ref 11.0–15.0)
TOTAL LYMPHOCYTE: 35.8 %
WBC mixed population: 511 cells/uL (ref 200–950)
WBC: 6.9 10*3/uL (ref 3.8–10.8)

## 2017-11-18 LAB — HEMOGLOBIN A1C
HEMOGLOBIN A1C: 6.2 %{Hb} — AB (ref <5.7)
MEAN PLASMA GLUCOSE: 131 (calc)
eAG (mmol/L): 7.3 (calc)

## 2017-11-18 LAB — TSH: TSH: 0.99 m[IU]/L (ref 0.40–4.50)

## 2017-11-18 LAB — LIPID PANEL
CHOLESTEROL: 180 mg/dL (ref <200)
HDL: 59 mg/dL (ref >50)
LDL Cholesterol (Calc): 103 mg/dL (calc) — ABNORMAL HIGH
Non-HDL Cholesterol (Calc): 121 mg/dL (calc) (ref <130)
Total CHOL/HDL Ratio: 3.1 (calc) (ref <5.0)
Triglycerides: 85 mg/dL (ref <150)

## 2017-11-18 LAB — VITAMIN D 25 HYDROXY (VIT D DEFICIENCY, FRACTURES): VIT D 25 HYDROXY: 43 ng/mL (ref 30–100)

## 2017-12-09 NOTE — Patient Instructions (Signed)
Please work on diet exercise and weight loss which would help both cholesterol and hemoglobin A1c.  Phentermine given for jumpstart or weight loss for 90 days only.  Return in 1 year or as needed.  Continue same medications

## 2017-12-13 ENCOUNTER — Other Ambulatory Visit: Payer: Self-pay | Admitting: Internal Medicine

## 2017-12-13 DIAGNOSIS — Z1231 Encounter for screening mammogram for malignant neoplasm of breast: Secondary | ICD-10-CM

## 2018-01-30 ENCOUNTER — Other Ambulatory Visit: Payer: Self-pay | Admitting: Internal Medicine

## 2018-01-30 MED FILL — TRIAMTERENE-HCTZ 75/50 TAB: 75-50 | 90 days supply | Qty: 90 | Fill #0

## 2018-01-30 MED FILL — SIMVASTATIN 10 MG TABS: 10 | 90 days supply | Qty: 90 | Fill #1

## 2018-03-08 ENCOUNTER — Ambulatory Visit
Admission: RE | Admit: 2018-03-08 | Discharge: 2018-03-08 | Disposition: A | Discharge status: Home / Self Care | Payer: 59 | Source: Ambulatory Visit | Attending: Internal Medicine | Admitting: Internal Medicine

## 2018-03-08 DIAGNOSIS — Z1231 Encounter for screening mammogram for malignant neoplasm of breast: Secondary | ICD-10-CM | POA: Diagnosis not present

## 2018-04-27 DIAGNOSIS — H524 Presbyopia: Secondary | ICD-10-CM | POA: Diagnosis not present

## 2018-04-27 DIAGNOSIS — H4031X3 Glaucoma secondary to eye trauma, right eye, severe stage: Secondary | ICD-10-CM | POA: Diagnosis not present

## 2018-05-28 ENCOUNTER — Other Ambulatory Visit: Payer: Self-pay | Admitting: Internal Medicine

## 2018-05-28 MED FILL — SIMVASTATIN 10 MG TABS: 10 | 90 days supply | Qty: 90 | Fill #0

## 2018-05-28 MED FILL — TRIAMTERENE-HCTZ 75/50 TAB: 75-50 | 90 days supply | Qty: 90 | Fill #1

## 2018-08-31 MED FILL — TRIAMTERENE-HCTZ 75/50 TAB: 75-50 | 90 days supply | Qty: 90 | Fill #2

## 2018-08-31 MED FILL — SIMVASTATIN 10 MG TABS: 10 | 90 days supply | Qty: 90 | Fill #1

## 2018-11-02 DIAGNOSIS — H25011 Cortical age-related cataract, right eye: Secondary | ICD-10-CM | POA: Diagnosis not present

## 2018-11-02 DIAGNOSIS — H4031X3 Glaucoma secondary to eye trauma, right eye, severe stage: Secondary | ICD-10-CM | POA: Diagnosis not present

## 2018-11-23 ENCOUNTER — Encounter: Payer: 59 | Admitting: Internal Medicine

## 2018-12-05 ENCOUNTER — Other Ambulatory Visit: Payer: Self-pay | Admitting: Internal Medicine

## 2018-12-05 MED FILL — TRIAMTERENE-HCTZ 75/50 TAB: 75-50 | 90 days supply | Qty: 90 | Fill #3

## 2018-12-05 MED FILL — SIMVASTATIN 10 MG TABLET: 10 | 90 days supply | Qty: 90 | Fill #0

## 2019-01-25 ENCOUNTER — Ambulatory Visit (INDEPENDENT_AMBULATORY_CARE_PROVIDER_SITE_OTHER): Payer: 59 | Admitting: Internal Medicine

## 2019-01-25 ENCOUNTER — Other Ambulatory Visit: Payer: Self-pay

## 2019-01-25 ENCOUNTER — Encounter: Payer: Self-pay | Admitting: Internal Medicine

## 2019-01-25 VITALS — BP 100/70 | HR 77 | Temp 98.3°F | Ht 63.0 in | Wt 209.0 lb

## 2019-01-25 DIAGNOSIS — H409 Unspecified glaucoma: Secondary | ICD-10-CM

## 2019-01-25 DIAGNOSIS — R7302 Impaired glucose tolerance (oral): Secondary | ICD-10-CM

## 2019-01-25 DIAGNOSIS — Z0000 Encounter for general adult medical examination without abnormal findings: Secondary | ICD-10-CM | POA: Diagnosis not present

## 2019-01-25 DIAGNOSIS — E8881 Metabolic syndrome: Secondary | ICD-10-CM

## 2019-01-25 DIAGNOSIS — R609 Edema, unspecified: Secondary | ICD-10-CM | POA: Diagnosis not present

## 2019-01-25 DIAGNOSIS — E7800 Pure hypercholesterolemia, unspecified: Secondary | ICD-10-CM

## 2019-01-25 DIAGNOSIS — E669 Obesity, unspecified: Secondary | ICD-10-CM | POA: Diagnosis not present

## 2019-01-25 DIAGNOSIS — Z23 Encounter for immunization: Secondary | ICD-10-CM

## 2019-01-25 LAB — POCT URINALYSIS DIPSTICK
Appearance: NEGATIVE
Bilirubin, UA: NEGATIVE
Blood, UA: NEGATIVE
Glucose, UA: NEGATIVE
Ketones, UA: NEGATIVE
Leukocytes, UA: NEGATIVE
Nitrite, UA: NEGATIVE
Odor: NEGATIVE
Protein, UA: NEGATIVE
Spec Grav, UA: 1.015 (ref 1.010–1.025)
Urobilinogen, UA: 0.2 E.U./dL
pH, UA: 6.5 (ref 5.0–8.0)

## 2019-01-25 MED ORDER — TRIAMTERENE-HCTZ 75-50 MG PO TABS: 1.0000 | ORAL_TABLET | Freq: Every day | ORAL | 0 refills | Status: DC

## 2019-01-25 MED ORDER — PHENTERMINE HCL 37.5 MG PO CAPS: 37.5000 mg | ORAL_CAPSULE | ORAL | 0 refills | Status: DC

## 2019-01-26 LAB — COMPLETE METABOLIC PANEL WITH GFR
AG Ratio: 0.8 (calc) — ABNORMAL LOW (ref 1.0–2.5)
ALT: 11 U/L (ref 6–29)
AST: 15 U/L (ref 10–35)
Albumin: 3.9 g/dL (ref 3.6–5.1)
Alkaline phosphatase (APISO): 77 U/L (ref 37–153)
BUN/Creatinine Ratio: 23 (calc) — ABNORMAL HIGH (ref 6–22)
BUN: 25 mg/dL (ref 7–25)
CO2: 25 mmol/L (ref 20–32)
Calcium: 9.9 mg/dL (ref 8.6–10.4)
Chloride: 99 mmol/L (ref 98–110)
Creat: 1.09 mg/dL — ABNORMAL HIGH (ref 0.50–0.99)
GFR, Est African American: 64 mL/min/{1.73_m2} (ref >=60)
GFR, Est Non African American: 55 mL/min/{1.73_m2} — ABNORMAL LOW (ref >=60)
Globulin: 4.6 g/dL (calc) — ABNORMAL HIGH (ref 1.9–3.7)
Glucose, Bld: 90 mg/dL (ref 65–99)
Potassium: 3.9 mmol/L (ref 3.5–5.3)
Sodium: 135 mmol/L (ref 135–146)
Total Bilirubin: 0.6 mg/dL (ref 0.2–1.2)
Total Protein: 8.5 g/dL — ABNORMAL HIGH (ref 6.1–8.1)

## 2019-01-26 LAB — CBC WITH DIFFERENTIAL/PLATELET
Absolute Monocytes: 616 cells/uL (ref 200–950)
Basophils Absolute: 64 cells/uL (ref 0–200)
Basophils Relative: 0.7 %
Eosinophils Absolute: 101 cells/uL (ref 15–500)
Eosinophils Relative: 1.1 %
HCT: 39.9 % (ref 35.0–45.0)
Hemoglobin: 13.1 g/dL (ref 11.7–15.5)
Lymphs Abs: 2806 cells/uL (ref 850–3900)
MCH: 30.1 pg (ref 27.0–33.0)
MCHC: 32.8 g/dL (ref 32.0–36.0)
MCV: 91.7 fL (ref 80.0–100.0)
MPV: 10.6 fL (ref 7.5–12.5)
Monocytes Relative: 6.7 %
Neutro Abs: 5612 cells/uL (ref 1500–7800)
Neutrophils Relative %: 61 %
Platelets: 269 10*3/uL (ref 140–400)
RBC: 4.35 10*6/uL (ref 3.80–5.10)
RDW: 12 % (ref 11.0–15.0)
Total Lymphocyte: 30.5 %
WBC: 9.2 10*3/uL (ref 3.8–10.8)

## 2019-01-26 LAB — MICROALBUMIN / CREATININE URINE RATIO
Creatinine, Urine: 92 mg/dL (ref 20–275)
Microalb Creat Ratio: 7 mcg/mg creat (ref <30)
Microalb, Ur: 0.6 mg/dL

## 2019-01-26 LAB — TSH: TSH: 0.69 mIU/L (ref 0.40–4.50)

## 2019-01-26 LAB — LIPID PANEL
Cholesterol: 197 mg/dL (ref <200)
HDL: 62 mg/dL (ref >=50)
LDL Cholesterol (Calc): 119 mg/dL (calc) — ABNORMAL HIGH
Non-HDL Cholesterol (Calc): 135 mg/dL (calc) — ABNORMAL HIGH (ref <130)
Total CHOL/HDL Ratio: 3.2 (calc) (ref <5.0)
Triglycerides: 68 mg/dL (ref <150)

## 2019-01-26 LAB — VITAMIN D 25 HYDROXY (VIT D DEFICIENCY, FRACTURES): Vit D, 25-Hydroxy: 39 ng/mL (ref 30–100)

## 2019-01-26 LAB — HEMOGLOBIN A1C
Hgb A1c MFr Bld: 6.2 % of total Hgb — ABNORMAL HIGH (ref <5.7)
Mean Plasma Glucose: 131 (calc)
eAG (mmol/L): 7.3 (calc)

## 2019-01-29 ENCOUNTER — Other Ambulatory Visit: Payer: Self-pay

## 2019-01-29 MED ORDER — METFORMIN HCL ER 500 MG PO TB24: 500.0000 mg | ORAL_TABLET | Freq: Every day | ORAL | 0 refills | Status: DC

## 2019-01-29 MED ORDER — SIMVASTATIN 20 MG PO TABS: 20.0000 mg | ORAL_TABLET | Freq: Every day | ORAL | 0 refills | Status: DC

## 2019-01-29 MED FILL — SIMVASTATIN 20 MG TABLET: 20 | 90 days supply | Qty: 90 | Fill #0

## 2019-01-29 MED FILL — metFORMIN HCL ER 500 MG TB2: 500 | 30 days supply | Qty: 30 | Fill #0

## 2019-02-10 NOTE — Progress Notes (Signed)
   Subjective:    Patient ID: Allison Shields, female    DOB: 1956/04/24, 63 y.o.   MRN: [id number]  HPI  63 year old Female for health maintenance exam and evaluation of medical issues.  Remains overweight at 209 pounds.  Last year weight was 211 pounds.  Generally gave her 90 days of phentermine to help her get him started with diet.  I have also recommended Dr. Migdalia Dk clinic in the past.  Her BMI is 37.02.  History of pure hypercholesterolemia dating back to at least 2012 on statin therapy.  History of impaired glucose tolerance.  History of dependent edema treated with diuretic.  No history of hypertension.  History of glaucoma treated at [facility] ophthalmology.  Had right eye surgery for glaucoma in 2008.  Social history: She works in Water engineer at [employer].  Does not smoke or consume alcohol.  She has a high school education.  She has 2 sons.  She is separated from her husband.  Her mother is Allison Shields who is also a patient here.  She has history of urticaria in the past that resolved with antihistamine.  A colonoscopy 2018.  2 polyps were removed 1 of which was hyperplastic and the other one was benign mucosa.  Prefers to be on high-dose vitamin D weekly rather than take over-the-counter daily vitamin D.  History of trichomonas which has been treated in the past.  Family history: A stroke with history of diabetes.  Mother with history of hypertension, glaucoma, hyperlipidemia, rheumatoid arthritis.  One brother alive and well.  One sister died of GI malignancy.  One sister with remote history of sarcoidosis that resolved.  Another sister with morbid obesity.  Total of 7 sisters.    Review of Systems no new complaints     Objective:   Physical Exam Blood pressure 100/70, pulse 77, temperature 98.3 degrees orally pulse oximetry 98% weight 209 pounds  BMI 37.02  Skin warm and dry.  Nodes none.  HEENT exam: TMs and pharynx are clear.  Neck is  supple without JVD thyromegaly or carotid bruits.  Chest clear to auscultation.  No adenopathy.  Breast without masses.  Cardiac exam regular rate and rhythm normal S1 and S2.  Extremities without edema.  Abdomen no hepatosplenomegaly masses or tenderness.  Neurological exam no focal deficits on brief neurological exam.  Bimanual normal.  Pap taken in 2018.       Assessment & Plan:  Morbid obesity- given phentermine 37.5 mg 1 p.o. daily for 90 days.  Needs to consider Dr. Migdalia Dk clinic  Dependent edema treated with diuretic  Hyperlipidemia treated with statin-LDL 119.  Increase Zocor to 20 mg daily.  Follow-up in 3 months.  Impaired glucose tolerance-hemoglobin A1c 6.2%.  Start metformin 500 mg extended release daily for glucose intolerance and follow-up in 3 months

## 2019-02-10 NOTE — Patient Instructions (Signed)
Phentermine prescribed for 90 days 1 p.o. daily for weight loss.  Consider Dr. Migdalia Dk clinic.  I am concerned about your continued weight issue.  Start metformin 500 mg ER daily.  Increase Zocor to 20 mg daily.  Follow-up in 3 months.

## 2019-02-27 MED FILL — metFORMIN HCL ER 500 MG TB2: 500 | 30 days supply | Qty: 30 | Fill #1

## 2019-02-27 MED FILL — TRIAMTERENE-HCTZ 75/50 TAB: 75-50 | 90 days supply | Qty: 90 | Fill #0

## 2019-03-29 MED FILL — metFORMIN HCL ER 500 MG TB2: 500 | 30 days supply | Qty: 30 | Fill #2

## 2019-04-04 ENCOUNTER — Other Ambulatory Visit: Payer: Self-pay | Admitting: Internal Medicine

## 2019-04-04 DIAGNOSIS — Z1231 Encounter for screening mammogram for malignant neoplasm of breast: Secondary | ICD-10-CM

## 2019-04-24 DIAGNOSIS — H25011 Cortical age-related cataract, right eye: Secondary | ICD-10-CM | POA: Diagnosis not present

## 2019-04-24 DIAGNOSIS — H4031X3 Glaucoma secondary to eye trauma, right eye, severe stage: Secondary | ICD-10-CM | POA: Diagnosis not present

## 2019-04-24 DIAGNOSIS — H2513 Age-related nuclear cataract, bilateral: Secondary | ICD-10-CM | POA: Diagnosis not present

## 2019-05-03 ENCOUNTER — Other Ambulatory Visit: Payer: 59 | Admitting: Internal Medicine

## 2019-05-03 ENCOUNTER — Other Ambulatory Visit: Payer: Self-pay

## 2019-05-03 DIAGNOSIS — E7800 Pure hypercholesterolemia, unspecified: Secondary | ICD-10-CM | POA: Diagnosis not present

## 2019-05-03 DIAGNOSIS — R7302 Impaired glucose tolerance (oral): Secondary | ICD-10-CM | POA: Diagnosis not present

## 2019-05-04 LAB — COMPLETE METABOLIC PANEL WITH GFR
AG Ratio: 0.9 (calc) — ABNORMAL LOW (ref 1.0–2.5)
ALT: 11 U/L (ref 6–29)
AST: 14 U/L (ref 10–35)
Albumin: 3.6 g/dL (ref 3.6–5.1)
Alkaline phosphatase (APISO): 68 U/L (ref 37–153)
BUN/Creatinine Ratio: 17 (calc) (ref 6–22)
BUN: 17 mg/dL (ref 7–25)
CO2: 26 mmol/L (ref 20–32)
Calcium: 9 mg/dL (ref 8.6–10.4)
Chloride: 102 mmol/L (ref 98–110)
Creat: 1.02 mg/dL — ABNORMAL HIGH (ref 0.50–0.99)
GFR, Est African American: 69 mL/min/{1.73_m2} (ref >=60)
GFR, Est Non African American: 60 mL/min/{1.73_m2} (ref >=60)
Globulin: 4.2 g/dL (calc) — ABNORMAL HIGH (ref 1.9–3.7)
Glucose, Bld: 93 mg/dL (ref 65–99)
Potassium: 4.3 mmol/L (ref 3.5–5.3)
Sodium: 138 mmol/L (ref 135–146)
Total Bilirubin: 0.4 mg/dL (ref 0.2–1.2)
Total Protein: 7.8 g/dL (ref 6.1–8.1)

## 2019-05-04 LAB — LIPID PANEL
Cholesterol: 169 mg/dL (ref <200)
HDL: 61 mg/dL (ref >=50)
LDL Cholesterol (Calc): 93 mg/dL (calc)
Non-HDL Cholesterol (Calc): 108 mg/dL (calc) (ref <130)
Total CHOL/HDL Ratio: 2.8 (calc) (ref <5.0)
Triglycerides: 61 mg/dL (ref <150)

## 2019-05-04 LAB — HEMOGLOBIN A1C
Hgb A1c MFr Bld: 6.2 % of total Hgb — ABNORMAL HIGH (ref <5.7)
Mean Plasma Glucose: 131 (calc)
eAG (mmol/L): 7.3 (calc)

## 2019-05-16 ENCOUNTER — Other Ambulatory Visit: Payer: Self-pay | Admitting: Internal Medicine

## 2019-05-16 MED FILL — SIMVASTATIN 20 MG TABLET: 20 | 90 days supply | Qty: 90 | Fill #0

## 2019-05-17 ENCOUNTER — Ambulatory Visit (INDEPENDENT_AMBULATORY_CARE_PROVIDER_SITE_OTHER): Payer: 59 | Admitting: Internal Medicine

## 2019-05-17 ENCOUNTER — Other Ambulatory Visit: Payer: Self-pay

## 2019-05-17 ENCOUNTER — Encounter: Payer: Self-pay | Admitting: Internal Medicine

## 2019-05-17 VITALS — BP 110/70 | HR 72 | Temp 98.3°F | Ht 63.0 in | Wt 209.0 lb

## 2019-05-17 DIAGNOSIS — E1169 Type 2 diabetes mellitus with other specified complication: Secondary | ICD-10-CM | POA: Diagnosis not present

## 2019-05-17 DIAGNOSIS — Z6837 Body mass index (BMI) 37.0-37.9, adult: Secondary | ICD-10-CM | POA: Diagnosis not present

## 2019-05-17 DIAGNOSIS — H409 Unspecified glaucoma: Secondary | ICD-10-CM | POA: Diagnosis not present

## 2019-05-17 DIAGNOSIS — E785 Hyperlipidemia, unspecified: Secondary | ICD-10-CM

## 2019-05-17 DIAGNOSIS — R609 Edema, unspecified: Secondary | ICD-10-CM

## 2019-05-17 DIAGNOSIS — E7800 Pure hypercholesterolemia, unspecified: Secondary | ICD-10-CM

## 2019-05-17 MED ORDER — TRIAMTERENE-HCTZ 75-50 MG PO TABS: 1.0000 | ORAL_TABLET | Freq: Every day | ORAL | 1 refills | Status: DC

## 2019-05-17 MED ORDER — METFORMIN HCL ER 500 MG PO TB24: 500.0000 mg | ORAL_TABLET | Freq: Every day | ORAL | 1 refills | Status: DC

## 2019-05-17 MED ORDER — SIMVASTATIN 20 MG PO TABS: ORAL_TABLET | ORAL | 1 refills | Status: DC

## 2019-05-17 MED FILL — metFORMIN HCL ER 500 MG TB2: 500 | 90 days supply | Qty: 90 | Fill #0

## 2019-05-17 NOTE — Progress Notes (Signed)
   Subjective:    Patient ID: MAKENLEIGH SPARLIN, female    DOB: Jul 16, 1955, 63 y.o.   MRN: [id number]  HPI 63 year old Female in today for 36-month follow-up.  Was seen in August for health maintenance exam.  Hemoglobin A1c was 6.2% and is still 6.2%.  LDL cholesterol has improved from  119 to 93 as we increased her Zocor from 10 mg to 20 mg.  Her creatinine is stable at 1.02.  Remains overweight.  BMI is 37.18 and weight is 209 pounds.  Has not lost any weight since August.  We are starting her on Metformin 500 mg XR daily  She will take Maxide 75 for dependent edema.  Longstanding history of dependent edema treated with diuretic.  Hypercholesterolemia dates back to at least 2012.  No history of hypertension.  History of glaucoma followed at [facility] ophthalmology.  History of vitamin D deficiency and is supposed to be taking over-the-counter vitamin D daily.  Level was 39 when checked in August.      Review of Systems see above     Objective:   Physical Exam Blood pressure stable at 110/70 Labs reviewed with patient      Assessment & Plan:  History of dependent edema treated with Maxide 75 daily  Hyperlipidemia-improved with increased dose of Zocor from 10 to 20 mg daily  Impaired glucose tolerance-start Glucotrol XL daily.  CPE due August.  Watch diet.  Try to lose weight.  BMI 37-needs to get exercise outside of work.  Plan: Excellent physical exam for August 2021.  Encourage diet exercise and weight loss.

## 2019-05-21 MED FILL — TRIAMTERENE-HCTZ 75/50 TAB: 75-50 | 90 days supply | Qty: 90 | Fill #0

## 2019-05-23 ENCOUNTER — Other Ambulatory Visit: Payer: Self-pay

## 2019-05-23 ENCOUNTER — Ambulatory Visit
Admission: RE | Admit: 2019-05-23 | Discharge: 2019-05-23 | Disposition: A | Discharge status: Home / Self Care | Payer: 59 | Source: Ambulatory Visit | Attending: Internal Medicine | Admitting: Internal Medicine

## 2019-05-23 DIAGNOSIS — Z1231 Encounter for screening mammogram for malignant neoplasm of breast: Secondary | ICD-10-CM

## 2019-06-09 NOTE — Patient Instructions (Addendum)
I am pleased her lipids are improved with increase in Zocor.  Would like for her to be serious about diet exercise and weight loss.  She has taken phentermine from time to time but weight does not really improve.  Follow-up in August.  Start Glucotrol XL daily

## 2019-06-24 ENCOUNTER — Other Ambulatory Visit: Payer: Self-pay

## 2019-06-24 ENCOUNTER — Encounter: Payer: Self-pay | Admitting: Internal Medicine

## 2019-06-24 ENCOUNTER — Ambulatory Visit: Payer: 59 | Admitting: Internal Medicine

## 2019-06-24 VITALS — Ht 63.0 in | Wt 204.0 lb

## 2019-06-24 DIAGNOSIS — R8290 Unspecified abnormal findings in urine: Secondary | ICD-10-CM | POA: Diagnosis not present

## 2019-06-24 DIAGNOSIS — R300 Dysuria: Secondary | ICD-10-CM

## 2019-06-24 DIAGNOSIS — K219 Gastro-esophageal reflux disease without esophagitis: Secondary | ICD-10-CM

## 2019-06-24 DIAGNOSIS — M7918 Myalgia, other site: Secondary | ICD-10-CM

## 2019-06-24 LAB — POCT URINALYSIS DIPSTICK
Bilirubin, UA: NEGATIVE
Blood, UA: NEGATIVE
Glucose, UA: NEGATIVE
Ketones, UA: NEGATIVE
Nitrite, UA: NEGATIVE
Protein, UA: POSITIVE — AB
Spec Grav, UA: 1.02 (ref 1.010–1.025)
Urobilinogen, UA: 0.2 E.U./dL
pH, UA: 6 (ref 5.0–8.0)

## 2019-06-24 MED ORDER — CYCLOBENZAPRINE HCL 10 MG PO TABS: ORAL_TABLET | ORAL | 0 refills | Status: DC

## 2019-06-24 MED ORDER — MELOXICAM 15 MG PO TABS: 15.0000 mg | ORAL_TABLET | Freq: Every day | ORAL | 0 refills | Status: DC

## 2019-06-24 MED ORDER — PANTOPRAZOLE SODIUM 40 MG PO TBEC: 40.0000 mg | DELAYED_RELEASE_TABLET | Freq: Every day | ORAL | 5 refills | Status: DC

## 2019-06-24 MED FILL — MELOXICAM 15 MG TABLET: 15 | 30 days supply | Qty: 30 | Fill #0

## 2019-06-24 MED FILL — CYCLOBENZAPRINE HCL 10 MG T: 10 | 60 days supply | Qty: 30 | Fill #0

## 2019-06-24 MED FILL — PANTOPRAZOLE SOD DR 40 MG T: 40 | 30 days supply | Qty: 30 | Fill #0

## 2019-06-25 LAB — URINE CULTURE
MICRO NUMBER:: 10027902
Result:: NO GROWTH
SPECIMEN QUALITY:: ADEQUATE

## 2019-07-14 NOTE — Progress Notes (Signed)
   Subjective:    Patient ID: Allison Shields, female    DOB: 07-Aug-1955, 64 y.o.   MRN: [id number]  HPI Patient complaining of dark-colored urine.  Specific gravity is 1.020.  Urine has trace LE.  Positive for for protein.  She has no dysuria.  Having some low back pain which is concerning to her.  She thought she might have a UTI.  Her job requires physical labor.    Review of Systems no nausea vomiting fever or chills     Objective:   Physical Exam  Straight leg raising is negative at 90 degrees bilaterally and muscle strength is normal.      Assessment & Plan:  Low back pain  Abnormal urine dipstick  Plan: Urine culture revealed no significant growth.  Given Flexeril 10 mg take 1/2 to 1 tablet at bedtime and Mobic 15 mg daily until pain has resolved a 10 days to 2 weeks.  Apply heating pad to back at night after work.

## 2019-07-14 NOTE — Patient Instructions (Signed)
Urine culture does not reveal urinary tract infection.  Take Flexeril at night and Mobic 15 mg daily with a meal for musculoskeletal back pain.

## 2019-07-23 ENCOUNTER — Other Ambulatory Visit: Payer: Self-pay | Admitting: Internal Medicine

## 2019-07-23 MED FILL — MELOXICAM 15 MG TABLET: 15 | 30 days supply | Qty: 30 | Fill #0

## 2019-07-23 MED FILL — PANTOPRAZOLE SOD DR 40 MG T: 40 | 30 days supply | Qty: 30 | Fill #1

## 2019-08-12 MED FILL — metFORMIN HCL ER 500 MG TB2: 500 | 90 days supply | Qty: 90 | Fill #1

## 2019-08-29 MED FILL — PANTOPRAZOLE SOD DR 40 MG T: 40 | 30 days supply | Qty: 30 | Fill #2

## 2019-08-29 MED FILL — SIMVASTATIN 20 MG TABLET: 20 | 90 days supply | Qty: 90 | Fill #1

## 2019-08-29 MED FILL — TRIAMTERENE-HCTZ 75-50 MG T: 75-50 | 90 days supply | Qty: 90 | Fill #1

## 2019-09-03 MED FILL — CYCLOBENZAPRINE HCL 10 MG T: 10 | 60 days supply | Qty: 30 | Fill #0

## 2019-09-04 MED FILL — MELOXICAM 15 MG TABLET: 15 | 30 days supply | Qty: 30 | Fill #1

## 2019-09-14 ENCOUNTER — Encounter (HOSPITAL_COMMUNITY): Payer: Self-pay

## 2019-09-14 ENCOUNTER — Other Ambulatory Visit: Payer: Self-pay

## 2019-09-14 ENCOUNTER — Inpatient Hospital Stay (HOSPITAL_COMMUNITY)
Admission: EM | Admit: 2019-09-14 | Discharge: 2019-09-17 | DRG: 418 | Disposition: A | Discharge status: Home / Self Care | Payer: 59 | Attending: Internal Medicine | Admitting: Internal Medicine

## 2019-09-14 ENCOUNTER — Emergency Department (HOSPITAL_COMMUNITY): Payer: 59

## 2019-09-14 DIAGNOSIS — E871 Hypo-osmolality and hyponatremia: Secondary | ICD-10-CM | POA: Diagnosis not present

## 2019-09-14 DIAGNOSIS — N2889 Other specified disorders of kidney and ureter: Secondary | ICD-10-CM | POA: Diagnosis present

## 2019-09-14 DIAGNOSIS — K851 Biliary acute pancreatitis without necrosis or infection: Secondary | ICD-10-CM | POA: Diagnosis not present

## 2019-09-14 DIAGNOSIS — K8010 Calculus of gallbladder with chronic cholecystitis without obstruction: Secondary | ICD-10-CM | POA: Diagnosis not present

## 2019-09-14 DIAGNOSIS — E860 Dehydration: Secondary | ICD-10-CM | POA: Diagnosis present

## 2019-09-14 DIAGNOSIS — K810 Acute cholecystitis: Secondary | ICD-10-CM | POA: Diagnosis present

## 2019-09-14 DIAGNOSIS — Z7984 Long term (current) use of oral hypoglycemic drugs: Secondary | ICD-10-CM | POA: Diagnosis not present

## 2019-09-14 DIAGNOSIS — Z03818 Encounter for observation for suspected exposure to other biological agents ruled out: Secondary | ICD-10-CM | POA: Diagnosis not present

## 2019-09-14 DIAGNOSIS — E876 Hypokalemia: Secondary | ICD-10-CM | POA: Diagnosis not present

## 2019-09-14 DIAGNOSIS — Z8249 Family history of ischemic heart disease and other diseases of the circulatory system: Secondary | ICD-10-CM

## 2019-09-14 DIAGNOSIS — E669 Obesity, unspecified: Secondary | ICD-10-CM | POA: Diagnosis present

## 2019-09-14 DIAGNOSIS — Z79899 Other long term (current) drug therapy: Secondary | ICD-10-CM

## 2019-09-14 DIAGNOSIS — Z800 Family history of malignant neoplasm of digestive organs: Secondary | ICD-10-CM

## 2019-09-14 DIAGNOSIS — Z20822 Contact with and (suspected) exposure to covid-19: Secondary | ICD-10-CM | POA: Diagnosis present

## 2019-09-14 DIAGNOSIS — R7302 Impaired glucose tolerance (oral): Secondary | ICD-10-CM | POA: Diagnosis present

## 2019-09-14 DIAGNOSIS — I10 Essential (primary) hypertension: Secondary | ICD-10-CM | POA: Diagnosis present

## 2019-09-14 DIAGNOSIS — N179 Acute kidney failure, unspecified: Secondary | ICD-10-CM | POA: Diagnosis present

## 2019-09-14 DIAGNOSIS — K859 Acute pancreatitis without necrosis or infection, unspecified: Secondary | ICD-10-CM | POA: Diagnosis not present

## 2019-09-14 DIAGNOSIS — Z6837 Body mass index (BMI) 37.0-37.9, adult: Secondary | ICD-10-CM | POA: Diagnosis not present

## 2019-09-14 DIAGNOSIS — K219 Gastro-esophageal reflux disease without esophagitis: Secondary | ICD-10-CM | POA: Diagnosis present

## 2019-09-14 DIAGNOSIS — Z83438 Family history of other disorder of lipoprotein metabolism and other lipidemia: Secondary | ICD-10-CM | POA: Diagnosis not present

## 2019-09-14 DIAGNOSIS — R1013 Epigastric pain: Secondary | ICD-10-CM | POA: Diagnosis not present

## 2019-09-14 DIAGNOSIS — R7989 Other specified abnormal findings of blood chemistry: Secondary | ICD-10-CM | POA: Diagnosis present

## 2019-09-14 DIAGNOSIS — H409 Unspecified glaucoma: Secondary | ICD-10-CM | POA: Diagnosis present

## 2019-09-14 DIAGNOSIS — Z833 Family history of diabetes mellitus: Secondary | ICD-10-CM | POA: Diagnosis not present

## 2019-09-14 DIAGNOSIS — K8012 Calculus of gallbladder with acute and chronic cholecystitis without obstruction: Secondary | ICD-10-CM | POA: Diagnosis present

## 2019-09-14 DIAGNOSIS — Z823 Family history of stroke: Secondary | ICD-10-CM | POA: Diagnosis not present

## 2019-09-14 DIAGNOSIS — K8020 Calculus of gallbladder without cholecystitis without obstruction: Secondary | ICD-10-CM | POA: Diagnosis not present

## 2019-09-14 DIAGNOSIS — N289 Disorder of kidney and ureter, unspecified: Secondary | ICD-10-CM | POA: Diagnosis not present

## 2019-09-14 DIAGNOSIS — E119 Type 2 diabetes mellitus without complications: Secondary | ICD-10-CM | POA: Diagnosis present

## 2019-09-14 DIAGNOSIS — E785 Hyperlipidemia, unspecified: Secondary | ICD-10-CM | POA: Diagnosis present

## 2019-09-14 DIAGNOSIS — Z9851 Tubal ligation status: Secondary | ICD-10-CM

## 2019-09-14 LAB — COMPREHENSIVE METABOLIC PANEL
ALT: 376 U/L — ABNORMAL HIGH (ref 0–44)
AST: 601 U/L — ABNORMAL HIGH (ref 15–41)
Albumin: 3.1 g/dL — ABNORMAL LOW (ref 3.5–5.0)
Alkaline Phosphatase: 270 U/L — ABNORMAL HIGH (ref 38–126)
Anion gap: 14 (ref 5–15)
BUN: 18 mg/dL (ref 8–23)
CO2: 24 mmol/L (ref 22–32)
Calcium: 9.2 mg/dL (ref 8.9–10.3)
Chloride: 96 mmol/L — ABNORMAL LOW (ref 98–111)
Creatinine, Ser: 1.28 mg/dL — ABNORMAL HIGH (ref 0.44–1.00)
GFR calc Af Amer: 52 mL/min — ABNORMAL LOW (ref >60)
GFR calc non Af Amer: 45 mL/min — ABNORMAL LOW (ref >60)
Glucose, Bld: 129 mg/dL — ABNORMAL HIGH (ref 70–99)
Potassium: 3.3 mmol/L — ABNORMAL LOW (ref 3.5–5.1)
Sodium: 134 mmol/L — ABNORMAL LOW (ref 135–145)
Total Bilirubin: 3.4 mg/dL — ABNORMAL HIGH (ref 0.3–1.2)
Total Protein: 8.3 g/dL — ABNORMAL HIGH (ref 6.5–8.1)

## 2019-09-14 LAB — CBC
HCT: 37.5 % (ref 36.0–46.0)
Hemoglobin: 12.4 g/dL (ref 12.0–15.0)
MCH: 30.3 pg (ref 26.0–34.0)
MCHC: 33.1 g/dL (ref 30.0–36.0)
MCV: 91.7 fL (ref 80.0–100.0)
Platelets: 299 10*3/uL (ref 150–400)
RBC: 4.09 MIL/uL (ref 3.87–5.11)
RDW: 12.8 % (ref 11.5–15.5)
WBC: 7.9 10*3/uL (ref 4.0–10.5)
nRBC: 0 % (ref 0.0–0.2)

## 2019-09-14 LAB — HIV ANTIBODY (ROUTINE TESTING W REFLEX): HIV Screen 4th Generation wRfx: NONREACTIVE

## 2019-09-14 LAB — URINALYSIS, ROUTINE W REFLEX MICROSCOPIC
Bilirubin Urine: NEGATIVE
Glucose, UA: NEGATIVE mg/dL
Hgb urine dipstick: NEGATIVE
Ketones, ur: NEGATIVE mg/dL
Leukocytes,Ua: NEGATIVE
Nitrite: NEGATIVE
Protein, ur: NEGATIVE mg/dL
Specific Gravity, Urine: >1.046 — ABNORMAL HIGH (ref 1.005–1.030)
pH: 5 (ref 5.0–8.0)

## 2019-09-14 LAB — RESPIRATORY PANEL BY RT PCR (FLU A&B, COVID)
Influenza A by PCR: NEGATIVE
Influenza B by PCR: NEGATIVE
SARS Coronavirus 2 by RT PCR: NEGATIVE

## 2019-09-14 LAB — LIPASE, BLOOD: Lipase: 1781 U/L — ABNORMAL HIGH (ref 11–51)

## 2019-09-14 MED ORDER — SODIUM CHLORIDE 0.9% FLUSH
3.0000 mL | Freq: Two times a day (BID) | INTRAVENOUS | Status: DC
  Administered 2019-09-14 – 2019-09-17 (×3): 3 mL via INTRAVENOUS

## 2019-09-14 MED ORDER — ENOXAPARIN SODIUM 40 MG/0.4ML ~~LOC~~ SOLN
40.0000 mg | SUBCUTANEOUS | Status: DC
  Administered 2019-09-14 – 2019-09-17 (×3): 40 mg via SUBCUTANEOUS
  Filled 2019-09-14 (×4): qty 0.4

## 2019-09-14 MED ORDER — MORPHINE SULFATE (PF) 4 MG/ML IV SOLN: 4.0000 mg | INTRAVENOUS | Status: DC | PRN

## 2019-09-14 MED ORDER — PANTOPRAZOLE SODIUM 40 MG PO TBEC
40.0000 mg | DELAYED_RELEASE_TABLET | Freq: Every day | ORAL | Status: DC
  Administered 2019-09-15 – 2019-09-17 (×2): 40 mg via ORAL
  Filled 2019-09-14 (×2): qty 1

## 2019-09-14 MED ORDER — CYCLOBENZAPRINE HCL 10 MG PO TABS
10.0000 mg | ORAL_TABLET | Freq: Every day | ORAL | Status: DC
  Administered 2019-09-14 – 2019-09-16 (×3): 10 mg via ORAL
  Filled 2019-09-14 (×3): qty 1

## 2019-09-14 MED ORDER — ONDANSETRON HCL 4 MG PO TABS: 4.0000 mg | ORAL_TABLET | Freq: Four times a day (QID) | ORAL | Status: DC | PRN

## 2019-09-14 MED ORDER — SODIUM CHLORIDE 0.9 % IV SOLN: INTRAVENOUS | Status: DC

## 2019-09-14 MED ORDER — MORPHINE SULFATE (PF) 2 MG/ML IV SOLN
2.0000 mg | INTRAVENOUS | Status: DC | PRN
  Administered 2019-09-14 – 2019-09-16 (×5): 2 mg via INTRAVENOUS
  Filled 2019-09-14 (×5): qty 1

## 2019-09-14 MED ORDER — SODIUM CHLORIDE 0.9 % IV BOLUS
1000.0000 mL | Freq: Once | INTRAVENOUS | Status: AC
  Administered 2019-09-14: 1000 mL via INTRAVENOUS

## 2019-09-14 MED ORDER — SODIUM CHLORIDE 0.9 % IV SOLN
2.0000 g | INTRAVENOUS | Status: DC
  Administered 2019-09-15 – 2019-09-17 (×3): 2 g via INTRAVENOUS
  Filled 2019-09-14: qty 2
  Filled 2019-09-14 (×2): qty 20

## 2019-09-14 MED ORDER — HYDRALAZINE HCL 20 MG/ML IJ SOLN: 10.0000 mg | INTRAMUSCULAR | Status: DC | PRN

## 2019-09-14 MED ORDER — IOHEXOL 300 MG/ML  SOLN
100.0000 mL | Freq: Once | INTRAMUSCULAR | Status: AC | PRN
  Administered 2019-09-14: 100 mL via INTRAVENOUS

## 2019-09-14 MED ORDER — SODIUM CHLORIDE 0.9 % IV SOLN
2.0000 g | Freq: Once | INTRAVENOUS | Status: AC
  Administered 2019-09-14: 2 g via INTRAVENOUS
  Filled 2019-09-14: qty 20

## 2019-09-14 MED ORDER — MORPHINE SULFATE (PF) 4 MG/ML IV SOLN
4.0000 mg | Freq: Once | INTRAVENOUS | Status: AC
  Administered 2019-09-14: 4 mg via INTRAVENOUS
  Filled 2019-09-14: qty 1

## 2019-09-14 MED ORDER — SODIUM CHLORIDE 0.9% FLUSH
3.0000 mL | Freq: Once | INTRAVENOUS | Status: AC
  Administered 2019-09-14: 05:00:00 3 mL via INTRAVENOUS

## 2019-09-14 MED ORDER — POTASSIUM CHLORIDE 10 MEQ/100ML IV SOLN
10.0000 meq | INTRAVENOUS | Status: AC
  Administered 2019-09-14 (×3): 10 meq via INTRAVENOUS
  Filled 2019-09-14 (×2): qty 100

## 2019-09-14 MED ORDER — ALBUTEROL SULFATE (2.5 MG/3ML) 0.083% IN NEBU: 2.5000 mg | INHALATION_SOLUTION | Freq: Four times a day (QID) | RESPIRATORY_TRACT | Status: DC | PRN

## 2019-09-14 MED ORDER — ONDANSETRON HCL 4 MG/2ML IJ SOLN: 4.0000 mg | Freq: Four times a day (QID) | INTRAMUSCULAR | Status: DC | PRN

## 2019-09-14 MED ORDER — ONDANSETRON HCL 4 MG/2ML IJ SOLN
4.0000 mg | Freq: Once | INTRAMUSCULAR | Status: AC
  Administered 2019-09-14: 05:00:00 4 mg via INTRAVENOUS
  Filled 2019-09-14: qty 2

## 2019-09-14 NOTE — Consult Note (Signed)
Reason for Consult:abd pain Referring Physician: Dr [name] is an 64 y.o. female.  HPI: 75 yof who has about a week of some epigastric pain that has gotten worse over that time.  She has pmh of dm, htn and elevated cholesterol.  This pain got worse, nothing making it better, started having n/v, back pain. She then decided to present to the er due to continued pain and was found to have gallstones on ct scan and then had a lipase over 1700 and I was asked to see her.  Works env services at [employer]  Past Medical History:  Diagnosis Date  . Dependent edema   . Diabetes mellitus   . Glaucoma   . Hyperlipidemia   . Hypertension   . Vitamin D deficiency     Past Surgical History:  Procedure Laterality Date  . EYE SURGERY  4/08   glaucoma; right eye  . TUBAL LIGATION      Family History  Problem Relation Age of Onset  . Hypertension Mother   . Hyperlipidemia Mother   . Diabetes Father   . Stroke Father   . Cancer Sister   . Colon cancer Paternal Uncle   . Esophageal cancer Neg Hx   . Rectal cancer Neg Hx   . Stomach cancer Neg Hx     Social History:  reports that she has never smoked. She has never used smokeless tobacco. She reports current alcohol use. She reports that she does not use drugs.  Allergies: No Known Allergies  Medications: I have reviewed the patient's current medications.  Results for orders placed or performed during the hospital encounter of 09/14/19 (from the past 48 hour(s))  Lipase, blood     Status: Abnormal   Collection Time: 09/14/19  3:44 AM  Result Value Ref Range   Lipase 1,781 (H) 11 - 51 U/L    Comment: RESULTS CONFIRMED BY MANUAL DILUTION Performed at [facility], [address] [place], [place] [address]   Comprehensive metabolic panel     Status: Abnormal   Collection Time: 09/14/19  3:44 AM  Result Value Ref Range   Sodium 134 (L) 135 - 145 mmol/L   Potassium 3.3 (L) 3.5 - 5.1 mmol/L   Chloride 96 (L) 98 -  111 mmol/L   CO2 24 22 - 32 mmol/L   Glucose, Bld 129 (H) 70 - 99 mg/dL    Comment: Glucose reference range applies only to samples taken after fasting for at least 8 hours.   BUN 18 8 - 23 mg/dL   Creatinine, Ser 1.28 (H) 0.44 - 1.00 mg/dL   Calcium 9.2 8.9 - 10.3 mg/dL   Total Protein 8.3 (H) 6.5 - 8.1 g/dL   Albumin 3.1 (L) 3.5 - 5.0 g/dL   AST 601 (H) 15 - 41 U/L   ALT 376 (H) 0 - 44 U/L   Alkaline Phosphatase 270 (H) 38 - 126 U/L   Total Bilirubin 3.4 (H) 0.3 - 1.2 mg/dL   GFR calc non Af Amer 45 (L) >60 mL/min   GFR calc Af Amer 52 (L) >60 mL/min   Anion gap 14 5 - 15    Comment: Performed at [facility] [address] [place] [address]  CBC     Status: None   Collection Time: 09/14/19  3:44 AM  Result Value Ref Range   WBC 7.9 4.0 - 10.5 K/uL   RBC 4.09 3.87 - 5.11 MIL/uL  Hemoglobin 12.4 12.0 - 15.0 g/dL   HCT 37.5 36.0 - 46.0 %   MCV 91.7 80.0 - 100.0 fL   MCH 30.3 26.0 - 34.0 pg   MCHC 33.1 30.0 - 36.0 g/dL   RDW 12.8 11.5 - 15.5 %   Platelets 299 150 - 400 K/uL   nRBC 0.0 0.0 - 0.2 %    Comment: Performed at [facility], [place] [address] [place], [place] [address]  Respiratory Panel by RT PCR (Flu A&B, Covid) - Nasopharyngeal Swab     Status: None   Collection Time: 09/14/19  7:50 AM   Specimen: Nasopharyngeal Swab  Result Value Ref Range   SARS Coronavirus 2 by RT PCR NEGATIVE NEGATIVE    Comment: (NOTE) SARS-CoV-2 target nucleic acids are NOT DETECTED. The SARS-CoV-2 RNA is generally detectable in upper respiratoy specimens during the acute phase of infection. The lowest concentration of SARS-CoV-2 viral copies this assay can detect is 131 copies/mL. A negative result does not preclude SARS-Cov-2 infection and should not be used as the sole basis for treatment or other patient management decisions. A negative result may occur with  improper specimen collection/handling, submission of specimen other than nasopharyngeal swab, presence  of viral mutation(s) within the areas targeted by this assay, and inadequate number of viral copies (<131 copies/mL). A negative result must be combined with clinical observations, patient history, and epidemiological information. The expected result is Negative. Fact Sheet for Patients:  PinkCheek.be Fact Sheet for Healthcare Providers:  GravelBags.it This test is not yet ap proved or cleared by the [place] FDA and  has been authorized for detection and/or diagnosis of SARS-CoV-2 by FDA under an Emergency Use Authorization (EUA). This EUA will remain  in effect (meaning this test can be used) for the duration of the COVID-19 declaration under Section 564(b)(1) of the Act, 21 U.S.C. section 360bbb-3(b)(1), unless the authorization is terminated or revoked sooner.    Influenza A by PCR NEGATIVE NEGATIVE   Influenza B by PCR NEGATIVE NEGATIVE    Comment: (NOTE) The Xpert Xpress SARS-CoV-2/FLU/RSV assay is intended as an aid in  the diagnosis of influenza from Nasopharyngeal swab specimens and  should not be used as a sole basis for treatment. Nasal washings and  aspirates are unacceptable for Xpert Xpress SARS-CoV-2/FLU/RSV  testing. Fact Sheet for Patients: PinkCheek.be Fact Sheet for Healthcare Providers: GravelBags.it This test is not yet approved or cleared by the [place] FDA and  has been authorized for detection and/or diagnosis of SARS-CoV-2 by  FDA under an Emergency Use Authorization (EUA). This EUA will remain  in effect (meaning this test can be used) for the duration of the  Covid-19 declaration under Section 564(b)(1) of the Act, 21  U.S.C. section 360bbb-3(b)(1), unless the authorization is  terminated or revoked. Performed at [facility], [place] [address] [place], [place] [address]   Urinalysis, Routine w reflex microscopic     Status:  Abnormal   Collection Time: 09/14/19  9:03 AM  Result Value Ref Range   Color, Urine YELLOW YELLOW   APPearance CLEAR CLEAR   Specific Gravity, Urine >1.046 (H) 1.005 - 1.030   pH 5.0 5.0 - 8.0   Glucose, UA NEGATIVE NEGATIVE mg/dL   Hgb urine dipstick NEGATIVE NEGATIVE   Bilirubin Urine NEGATIVE NEGATIVE   Ketones, ur NEGATIVE NEGATIVE mg/dL   Protein, ur NEGATIVE NEGATIVE mg/dL   Nitrite NEGATIVE NEGATIVE   Leukocytes,Ua NEGATIVE NEGATIVE    Comment: Performed at  [facility], [place] [address], [place], [place] [address]  HIV Antibody (routine testing w rflx)     Status: None   Collection Time: 09/14/19  9:03 AM  Result Value Ref Range   HIV Screen 4th Generation wRfx NON REACTIVE NON REACTIVE    Comment: Performed at [facility] [address], [place], [place] [address]    CT ABDOMEN PELVIS W CONTRAST  Result Date: 09/14/2019 CLINICAL DATA:  Abdominal abscess/infection suspected. EXAM: CT ABDOMEN AND PELVIS WITH CONTRAST TECHNIQUE: Multidetector CT imaging of the abdomen and pelvis was performed using the standard protocol following bolus administration of intravenous contrast. CONTRAST:  147mL OMNIPAQUE IOHEXOL 300 MG/ML  SOLN COMPARISON:  None. FINDINGS: Lower chest: Mild bibasilar atelectasis and or scarring. Hepatobiliary: No focal liver abnormality. Gallbladder walls appear thickened with surrounding pericholecystic fluid/edema. Gallstone measures approximately 1.6 cm. No common bile duct dilatation seen. Pancreas: Questionable subtle fluid stranding about the pancreatic body/tail. Spleen: Normal in size without focal abnormality. Adrenals/Urinary Tract: Adrenal glands appear normal. Hypodense masslike structure exophytic to the superior pole of the LEFT kidney, measuring 1.2 cm, not compatible with a simple cyst by density measurements. No renal stone or hydronephrosis bilaterally. No ureteral or bladder calculi identified. Bladder is unremarkable. Stomach/Bowel: No  dilated large or small bowel loops. No evidence of bowel wall inflammation. Appendix is normal. Stomach is unremarkable, partially decompressed. Vascular/Lymphatic: No significant vascular findings are present. No enlarged abdominal or pelvic lymph nodes. Reproductive: Uterus and bilateral adnexa are unremarkable. Other: No abscess collection seen. No free intraperitoneal air. Musculoskeletal: Degenerative spondylosis of the lumbar spine, mild to moderate in degree. No acute or suspicious osseous finding. IMPRESSION: 1. Gallbladder walls appear thickened with surrounding pericholecystic fluid/edema. Findings are suspicious for acute cholecystitis. Recommend RIGHT upper quadrant ultrasound for further characterization. Gallstone measures approximately 1.6 cm. 2. Questionable subtle fluid stranding about the pancreatic body/tail. Recommend correlation with pancreatic enzymes to exclude concomitant acute pancreatitis. 3. **An incidental finding of potential clinical significance has been found. Hypodense masslike structure exophytic to the superior pole of the LEFT kidney, measuring 1.2 cm, not compatible with a simple cyst by density measurements. This could represent a solid neoplastic mass or complex cyst. Recommend nonemergent renal MRI for further characterization.** 4. Remainder of the abdomen and pelvis CT is unremarkable. No bowel obstruction or evidence of bowel wall inflammation. No evidence of acute solid organ abnormality. No renal or ureteral calculi. Appendix is normal. Electronically Signed   By: Franki Cabot M.D.   On: 09/14/2019 06:58    Review of Systems  Constitutional: Negative for fever.  Gastrointestinal: Positive for abdominal pain, nausea and vomiting. Negative for abdominal distention.  Musculoskeletal: Positive for back pain.  All other systems reviewed and are negative.  Blood pressure 98/61, pulse 62, temperature 97.9 F (36.6 C), temperature source Oral, resp. rate 16, height 5'  1" (1.549 m), weight 90.7 kg, SpO2 99 %. Physical Exam  Vitals reviewed. Constitutional: She is oriented to person, place, and time. She appears well-developed and well-nourished. No distress.  HENT:  Head: Normocephalic and atraumatic.  Right Ear: External ear normal.  Left Ear: External ear normal.  Mouth/Throat: Oropharynx is clear and moist.  Eyes: Pupils are equal, round, and reactive to light. EOM are normal. No scleral icterus.  Neck: No tracheal deviation present.  Cardiovascular: Normal rate, regular rhythm, normal heart sounds and intact distal pulses.  Respiratory: Effort normal. She has wheezes.  GI: Soft. Bowel sounds are normal. There  is abdominal tenderness (minimally tender epigastrium).  Musculoskeletal:        General: No deformity or edema.     Cervical back: Neck supple.  Lymphadenopathy:    She has no cervical adenopathy.  Neurological: She is alert and oriented to person, place, and time. She has normal strength. GCS eye subscore is 4. GCS verbal subscore is 5. GCS motor subscore is 6.  Skin: Skin is warm and dry. No pallor.  Psychiatric: She has a normal mood and affect. Her behavior is normal.    Assessment/Plan: Gallstone pancreatitis -no need for mrcp -would just leave npo for now, recheck labs - clnically doesn't have cholecystitis -will plan for lap chole once pancreatitis resolves. Likely next 48 hours  Allison Shields 09/14/2019, 11:12 AM

## 2019-09-14 NOTE — Consult Note (Addendum)
Referring Provider:  [facility]         Primary Care Physician:  Elby Showers, MD Primary Gastroenterologist:    Scarlette Shorts, MD          We were asked to see this patient for:   pancreatitis               ASSESSMENT /  PLAN    64 yo female with PMH significant for HTN, hyperlipidemia, DM, diverticulosis, glaucoma  # Acute pancreatitis --probably biliary pancreatitis, especially with concurrent liver tests abnormalities though less likely, consider medication induced  (HCTZ , meloxicam, statin)  --Cholelithiasis without evidence for bile duct dilation on CT scan  --MRCP has been ordered .  --Surgical evaluation at some point  Addendum:  [facility] has seen and entered note while I was seeing patient. Their plan for lap cholecystectomy when pancreatitis resolves. I'm guessing they will do IOC at the time. Will cancer MRCP  # ? Acute cholecystitis by CT scan --MRCP may further clarify  # AKI, ? On CKD --Cr 1.28 --Baseline GFR upper 50's  # Mild hypokalemia, K+ 3.3  # Left renal lesion, 1.2 cm -not c/w cyst. Rule out neoplasm.      [facility] Attending   I have taken an interval history, reviewed the chart and examined the patient. I agree with the Advanced Practitioner's note, impression and recommendations.    Appreciate surgery help - wait on IOC at lap chole and can intervene if needed (ERCP).  Gatha Mayer, MD, [facility] 09/14/2019 6:40 PM   HPI:    Chief Complaint: abdominal pain  Allison Shields is a 64 y.o. female known to Dr. Henrene Pastor from screening colonoscopy July 2018 .    Patient developed upper abdominal pain and back pain a few weeks ago for which PCP gave her PPI and meloxicam.  The back pain was really more of a discomfort at that time.  The epigastric pain was intermittent and PPI did help.  Yesterday after lunch ( soup) however she developed progressive epigastric pain radiating through to her back.  Pain was severe, being 8  out of 10 . She subsequently had episode of nausea / vomiting.  After work she went home to take a shower, applied heating pad to her back but symptoms escalated and she presented to the ED. Lipase 1700. ALK phos 270, AST 600 / ALT 376, t bili 3.4. Liver tests historically normal. SARS negative.  Afebrile with normal CBC. CT scan  abd/ pelvis with contrast suggessts acute cholecystitis / cholelithiasis / possible acute pancreatitis.  Patient denies chronic GI problems.  Bowel movements have been normal, weight has been stable.  Currently she is comfortable after receiving pain medications earlier this morning.   Past Medical History:  Diagnosis Date  . Dependent edema   . Diabetes mellitus   . Glaucoma   . Hyperlipidemia   . Hypertension   . Vitamin D deficiency     Past Surgical History:  Procedure Laterality Date  . EYE SURGERY  4/08   glaucoma; right eye  . TUBAL LIGATION      Prior to Admission medications   Medication Sig Start Date End Date Taking? Authorizing Provider  cholecalciferol (VITAMIN D) 1000 UNITS tablet Take 1,000 Units by mouth daily.     Yes [provider]  cyclobenzaprine (FLEXERIL) 10 MG tablet TAKE 1/2 TABLET BY MOUTH AT BEDTIME Patient taking differently: Take 10 mg by mouth at bedtime.  07/23/19  Yes Baxley, Cresenciano Lick, MD  meloxicam (MOBIC) 15 MG tablet TAKE 1 TABLET BY MOUTH ONCE A DAY Patient taking differently: Take 15 mg by mouth daily.  07/23/19  Yes Elby Showers, MD  metFORMIN (GLUCOPHAGE-XR) 500 MG 24 hr tablet Take 1 tablet (500 mg total) by mouth daily with breakfast. 05/17/19  Yes Baxley, Cresenciano Lick, MD  pantoprazole (PROTONIX) 40 MG tablet Take 1 tablet (40 mg total) by mouth daily. 06/24/19  Yes Baxley, Cresenciano Lick, MD  simvastatin (ZOCOR) 20 MG tablet TAKE 1 TABLET BY MOUTH AT BEDTIME (STOP SIMVASTATIN 10MG) Patient taking differently: Take 20 mg by mouth daily at 6 PM.  05/17/19  Yes Baxley, Cresenciano Lick, MD  triamterene-hydrochlorothiazide (MAXZIDE) 75-50 MG  tablet Take 1 tablet by mouth daily. 05/17/19  Yes BaxleyCresenciano Lick, MD  Blood Glucose Monitoring Suppl (TRUERESULT BLOOD GLUCOSE) W/DEVICE KIT USE AS DIRECTED 01/07/13   Elby Showers, MD  cyclobenzaprine (FLEXERIL) 10 MG tablet TAKE 1/2 TO 1 TABLET BY MOUTH AT BEDTIME FOR BACK PAIN Patient not taking: Reported on 09/14/2019 05/07/16   Elby Showers, MD  phentermine 37.5 MG capsule Take 1 capsule (37.5 mg total) by mouth every morning. Patient not taking: Reported on 09/14/2019 01/25/19   Elby Showers, MD    Current Facility-Administered Medications  Medication Dose Route Frequency Provider Last Rate Last Admin  . 0.9 %  sodium chloride infusion   Intravenous Continuous Fuller Plan A, MD 150 mL/hr at 09/14/19 1006 New Bag at 09/14/19 1006  . albuterol (PROVENTIL) (2.5 MG/3ML) 0.083% nebulizer solution 2.5 mg  2.5 mg Nebulization Q6H PRN Norval Morton, MD      . Derrill Memo ON 09/15/2019] cefTRIAXone (ROCEPHIN) 2 g in sodium chloride 0.9 % 100 mL IVPB  2 g Intravenous Q24H Smith, Rondell A, MD      . cyclobenzaprine (FLEXERIL) tablet 10 mg  10 mg Oral QHS Smith, Rondell A, MD      . enoxaparin (LOVENOX) injection 40 mg  40 mg Subcutaneous Q24H Smith, Rondell A, MD   40 mg at 09/14/19 1009  . hydrALAZINE (APRESOLINE) injection 10 mg  10 mg Intravenous Q4H PRN Smith, Rondell A, MD      . morphine 2 MG/ML injection 2 mg  2 mg Intravenous Q2H PRN Smith, Rondell A, MD      . ondansetron (ZOFRAN) tablet 4 mg  4 mg Oral Q6H PRN Fuller Plan A, MD       Or  . ondansetron (ZOFRAN) injection 4 mg  4 mg Intravenous Q6H PRN Smith, Rondell A, MD      . pantoprazole (PROTONIX) EC tablet 40 mg  40 mg Oral Daily Smith, Rondell A, MD      . sodium chloride flush (NS) 0.9 % injection 3 mL  3 mL Intravenous Q12H Smith, Rondell A, MD   3 mL at 09/14/19 1007    Allergies as of 09/14/2019  . (No Known Allergies)    Family History  Problem Relation Age of Onset  . Hypertension Mother   . Hyperlipidemia Mother     . Diabetes Father   . Stroke Father   . Cancer Sister   . Colon cancer Paternal Uncle   . Esophageal cancer Neg Hx   . Rectal cancer Neg Hx   . Stomach cancer Neg Hx     Social History   Socioeconomic History  . Marital status: Legally Separated    Spouse name: Not on file  .  Number of children: Not on file  . Years of education: Not on file  . Highest education level: Not on file  Occupational History  . Not on file  Tobacco Use  . Smoking status: Never Smoker  . Smokeless tobacco: Never Used  Substance and Sexual Activity  . Alcohol use: Yes    Comment: social  . Drug use: No  . Sexual activity: Not on file  Other Topics Concern  . Not on file  Social History Narrative  . Not on file   Social Determinants of Health   Financial Resource Strain:   . Difficulty of Paying Living Expenses:   Food Insecurity:   . Worried About Charity fundraiser in the Last Year:   . Arboriculturist in the Last Year:   Transportation Needs:   . Film/video editor (Medical):   Marland Kitchen Lack of Transportation (Non-Medical):   Physical Activity:   . Days of Exercise per Week:   . Minutes of Exercise per Session:   Stress:   . Feeling of Stress :   Social Connections:   . Frequency of Communication with Friends and Family:   . Frequency of Social Gatherings with Friends and Family:   . Attends Religious Services:   . Active Member of Clubs or Organizations:   . Attends Archivist Meetings:   Marland Kitchen Marital Status:   Intimate Partner Violence:   . Fear of Current or Ex-Partner:   . Emotionally Abused:   Marland Kitchen Physically Abused:   . Sexually Abused:     Review of Systems: All systems reviewed and negative except where noted in HPI.  Physical Exam: Vital signs in last 24 hours: Temp:  [97.9 F (36.6 C)-98.1 F (36.7 C)] 97.9 F (36.6 C) (04/03 0958) Pulse Rate:  [62-90] 62 (04/03 0958) Resp:  [13-23] 16 (04/03 0958) BP: (98-129)/(61-80) 98/61 (04/03 0958) SpO2:  [94  %-100 %] 99 % (04/03 0958) Weight:  [90.7 kg] 90.7 kg (04/03 0334) Last BM Date: 09/14/19(in am per pt report) General:   Alert, well-developed,  female in NAD Psych:  Pleasant, cooperative. Normal mood and affect. Eyes:  Pupils equal, sclera clear, no icterus.   Conjunctiva pink. Ears:  Normal auditory acuity. Nose:  No deformity, discharge,  or lesions. Neck:  Supple; no masses Lungs:  Clear throughout to auscultation.   No wheezes, crackles, or rhonchi.  Heart:  Regular rate and rhythm; soft murmur, no lower extremity edema Abdomen:  Soft, non-distended, mild epigastric tenderness, BS active, no palp mass   Rectal:  Deferred  Msk:  Symmetrical without gross deformities. . Neurologic:  Alert and  oriented x4;  grossly normal neurologically. Skin:  Intact without significant lesions or rashes.   Intake/Output from previous day: No intake/output data recorded. Intake/Output this shift: Total I/O In: 100 [IV Piggyback:100] Out: -   Lab Results: Recent Labs    09/14/19 0344  WBC 7.9  HGB 12.4  HCT 37.5  PLT 299   BMET Recent Labs    09/14/19 0344  NA 134*  K 3.3*  CL 96*  CO2 24  GLUCOSE 129*  BUN 18  CREATININE 1.28*  CALCIUM 9.2   LFT Recent Labs    09/14/19 0344  PROT 8.3*  ALBUMIN 3.1*  AST 601*  ALT 376*  ALKPHOS 270*  BILITOT 3.4*   PT/INR No results for input(s): LABPROT, INR in the last 72 hours. Hepatitis Panel No results for input(s): HEPBSAG, HCVAB, [place], HEPBIGM in the  last 72 hours.   . CBC Latest Ref Rng & Units 09/14/2019 01/25/2019 11/17/2017  WBC 4.0 - 10.5 K/uL 7.9 9.2 6.9  Hemoglobin 12.0 - 15.0 g/dL 12.4 13.1 12.5  Hematocrit 36.0 - 46.0 % 37.5 39.9 37.4  Platelets 150 - 400 K/uL 299 269 275    . CMP Latest Ref Rng & Units 09/14/2019 05/03/2019 01/25/2019  Glucose 70 - 99 mg/dL 129(H) 93 90  BUN 8 - 23 mg/dL 18 17 25   Creatinine 0.44 - 1.00 mg/dL 1.28(H) 1.02(H) 1.09(H)  Sodium 135 - 145 mmol/L 134(L) 138 135  Potassium 3.5 -  5.1 mmol/L 3.3(L) 4.3 3.9  Chloride 98 - 111 mmol/L 96(L) 102 99  CO2 22 - 32 mmol/L 24 26 25   Calcium 8.9 - 10.3 mg/dL 9.2 9.0 9.9  Total Protein 6.5 - 8.1 g/dL 8.3(H) 7.8 8.5(H)  Total Bilirubin 0.3 - 1.2 mg/dL 3.4(H) 0.4 0.6  Alkaline Phos 38 - 126 U/L 270(H) - -  AST 15 - 41 U/L 601(H) 14 15  ALT 0 - 44 U/L 376(H) 11 11   Studies/Results: CT ABDOMEN PELVIS W CONTRAST  Result Date: 09/14/2019 CLINICAL DATA:  Abdominal abscess/infection suspected. EXAM: CT ABDOMEN AND PELVIS WITH CONTRAST TECHNIQUE: Multidetector CT imaging of the abdomen and pelvis was performed using the standard protocol following bolus administration of intravenous contrast. CONTRAST:  137m OMNIPAQUE IOHEXOL 300 MG/ML  SOLN COMPARISON:  None. FINDINGS: Lower chest: Mild bibasilar atelectasis and or scarring. Hepatobiliary: No focal liver abnormality. Gallbladder walls appear thickened with surrounding pericholecystic fluid/edema. Gallstone measures approximately 1.6 cm. No common bile duct dilatation seen. Pancreas: Questionable subtle fluid stranding about the pancreatic body/tail. Spleen: Normal in size without focal abnormality. Adrenals/Urinary Tract: Adrenal glands appear normal. Hypodense masslike structure exophytic to the superior pole of the LEFT kidney, measuring 1.2 cm, not compatible with a simple cyst by density measurements. No renal stone or hydronephrosis bilaterally. No ureteral or bladder calculi identified. Bladder is unremarkable. Stomach/Bowel: No dilated large or small bowel loops. No evidence of bowel wall inflammation. Appendix is normal. Stomach is unremarkable, partially decompressed. Vascular/Lymphatic: No significant vascular findings are present. No enlarged abdominal or pelvic lymph nodes. Reproductive: Uterus and bilateral adnexa are unremarkable. Other: No abscess collection seen. No free intraperitoneal air. Musculoskeletal: Degenerative spondylosis of the lumbar spine, mild to moderate in degree.  No acute or suspicious osseous finding. IMPRESSION: 1. Gallbladder walls appear thickened with surrounding pericholecystic fluid/edema. Findings are suspicious for acute cholecystitis. Recommend RIGHT upper quadrant ultrasound for further characterization. Gallstone measures approximately 1.6 cm. 2. Questionable subtle fluid stranding about the pancreatic body/tail. Recommend correlation with pancreatic enzymes to exclude concomitant acute pancreatitis. 3. **An incidental finding of potential clinical significance has been found. Hypodense masslike structure exophytic to the superior pole of the LEFT kidney, measuring 1.2 cm, not compatible with a simple cyst by density measurements. This could represent a solid neoplastic mass or complex cyst. Recommend nonemergent renal MRI for further characterization.** 4. Remainder of the abdomen and pelvis CT is unremarkable. No bowel obstruction or evidence of bowel wall inflammation. No evidence of acute solid organ abnormality. No renal or ureteral calculi. Appendix is normal. Electronically Signed   By: SFranki CabotM.D.   On: 09/14/2019 06:58    Active Problems:   Gallstone pancreatitis    PTye Savoy NP-C @  09/14/2019, 10:52 AM

## 2019-09-14 NOTE — Progress Notes (Signed)
Patient arrived to 6N15 via stretcher from ED, ambulatory, AOx4, VSS, denies pain, oriented to room, call bell and use of bed controls. Will continue to monitor.

## 2019-09-14 NOTE — ED Provider Notes (Signed)
GI consult pending. Admit for gallstone pancreatitis. Physical Exam  BP 108/61   Pulse 70   Temp 98.1 F (36.7 C) (Oral)   Resp 15   Ht 5\' 1"  (1.549 m)   Wt 90.7 kg   SpO2 94%   BMI 37.79 kg/m   Physical Exam  ED Course/Procedures     Procedures  MDM   Consult: [facility] Dr. Tamala Julian for admission.      Charlesetta Shanks, MD 09/14/19 0830

## 2019-09-14 NOTE — ED Notes (Signed)
Pt is NSR on monitor 

## 2019-09-14 NOTE — H&P (Addendum)
History and Physical    AMI MALLY WHQ:[id number] DOB: 29-Aug-1955 DOA: 09/14/2019  Referring MD/NP/PA: Charlesetta Shanks, MD PCP: Elby Showers, MD  Patient coming from: Home  Chief Complaint: Abdominal pain  I have personally briefly reviewed patient's old medical records in [place]   HPI: BASYA CASAVANT is a 64 y.o. female with medical history significant of hypertension, hyperlipidemia, diabetes mellitus type 2 and glaucoma who presented with complaints of epigastric abdominal pain.  She has had intermittent abdominal pain over the last couple days for which she thought was due to reflux.  She would have nausea and vomiting and symptoms would resolve.  However, yesterday after eating at work she had acute onset of excruciating pain epigastrically that radiated to her back.  Denies having any fever, diarrhea, chest pain, shortness of breath.  Normally patient drinks alcohol possibly once a week on the weekend.  She still has her gallbladder and has not had anything similar to this happened previously in the past.  ED Course: Upon admission into the emergency department patient was noted to have stable vital signs.  Labs significant for sodium 134, potassium 3.3, creatinine 1.24, alkaline phosphatase 270, AST 61, ALT 376, total bilirubin 3.4, albumin 3.1, and lipase 1781.  CT scan of the abdomen and pelvis revealed gallbladder wall thickening concerning for cholelithiasis with acute cholecystitis, pancreatitis, and a masslike structure measuring 1.2 cm in the left kidney.  1 L of normal saline IV fluids, Rocephin, Zofran, and morphine.  TRH called to admit.  Review of Systems  Constitutional: Negative for fever and malaise/fatigue.  HENT: Negative for nosebleeds.   Eyes: Negative for photophobia and pain.  Respiratory: Negative for cough, shortness of breath and stridor.   Cardiovascular: Negative for chest pain and orthopnea.  Gastrointestinal: Positive for abdominal pain, nausea  and vomiting. Negative for constipation and diarrhea.  Genitourinary: Negative for dysuria and hematuria.  Musculoskeletal: Negative for falls.  Skin: Negative for itching.  Neurological: Negative for focal weakness and loss of consciousness.  Psychiatric/Behavioral: Negative for memory loss and substance abuse.    Past Medical History:  Diagnosis Date  . Dependent edema   . Diabetes mellitus   . Glaucoma   . Hyperlipidemia   . Hypertension   . Vitamin D deficiency     Past Surgical History:  Procedure Laterality Date  . EYE SURGERY  4/08   glaucoma; right eye  . TUBAL LIGATION       reports that she has never smoked. She has never used smokeless tobacco. She reports current alcohol use. She reports that she does not use drugs.  No Known Allergies  Family History  Problem Relation Age of Onset  . Hypertension Mother   . Hyperlipidemia Mother   . Diabetes Father   . Stroke Father   . Cancer Sister   . Colon cancer Paternal Uncle   . Esophageal cancer Neg Hx   . Rectal cancer Neg Hx   . Stomach cancer Neg Hx     Prior to Admission medications   Medication Sig Start Date End Date Taking? Authorizing Provider  cholecalciferol (VITAMIN D) 1000 UNITS tablet Take 1,000 Units by mouth daily.     Yes [provider]  cyclobenzaprine (FLEXERIL) 10 MG tablet TAKE 1/2 TABLET BY MOUTH AT BEDTIME Patient taking differently: Take 10 mg by mouth at bedtime.  07/23/19  Yes Baxley, Cresenciano Lick, MD  meloxicam (MOBIC) 15 MG tablet TAKE 1 TABLET BY MOUTH ONCE A DAY  Patient taking differently: Take 15 mg by mouth daily.  07/23/19  Yes Elby Showers, MD  metFORMIN (GLUCOPHAGE-XR) 500 MG 24 hr tablet Take 1 tablet (500 mg total) by mouth daily with breakfast. 05/17/19  Yes Baxley, Cresenciano Lick, MD  pantoprazole (PROTONIX) 40 MG tablet Take 1 tablet (40 mg total) by mouth daily. 06/24/19  Yes Baxley, Cresenciano Lick, MD  simvastatin (ZOCOR) 20 MG tablet TAKE 1 TABLET BY MOUTH AT BEDTIME (STOP SIMVASTATIN  10MG) Patient taking differently: Take 20 mg by mouth daily at 6 PM.  05/17/19  Yes Baxley, Cresenciano Lick, MD  triamterene-hydrochlorothiazide (MAXZIDE) 75-50 MG tablet Take 1 tablet by mouth daily. 05/17/19  Yes BaxleyCresenciano Lick, MD  Blood Glucose Monitoring Suppl (TRUERESULT BLOOD GLUCOSE) W/DEVICE KIT USE AS DIRECTED 01/07/13   Elby Showers, MD  cyclobenzaprine (FLEXERIL) 10 MG tablet TAKE 1/2 TO 1 TABLET BY MOUTH AT BEDTIME FOR BACK PAIN Patient not taking: Reported on 09/14/2019 05/07/16   Elby Showers, MD  phentermine 37.5 MG capsule Take 1 capsule (37.5 mg total) by mouth every morning. Patient not taking: Reported on 09/14/2019 01/25/19   Elby Showers, MD    Physical Exam:  Constitutional: Female in NAD, calm, comfortable Vitals:   09/14/19 0744 09/14/19 0745 09/14/19 0800 09/14/19 0815  BP: 119/64  113/65   Pulse: 71 64 73 72  Resp: _0 Temp:      TempSrc:      SpO2: 98% 99% 94% 96%  Weight:      Height:       Eyes: PERRL, lids and conjunctivae normal ENMT: Mucous membranes are dry. Posterior pharynx clear of any exudate or lesions.  Neck: normal, supple, no masses, no thyromegaly Respiratory: clear to auscultation bilaterally, no wheezing, no crackles. Normal respiratory effort. No accessory muscle use.  Cardiovascular: Regular rate and rhythm, no murmurs / rubs / gallops. No extremity edema. 2+ pedal pulses. No carotid bruits.  Abdomen: Tenderness to palpation epigastrically, no masses palpated. No hepatosplenomegaly. Bowel sounds positive.  Musculoskeletal: no clubbing / cyanosis. No joint deformity upper and lower extremities. Good ROM, no contractures. Normal muscle tone.  Skin: no rashes, lesions, ulcers. No induration Neurologic: CN 2-12 grossly intact. Sensation intact, DTR normal. Strength 5/5 in all 4.  Psychiatric: Normal judgment and insight. Alert and oriented x 3. Normal mood.     Labs on Admission: I have personally reviewed following labs and imaging  studies  CBC: Recent Labs  Lab 09/14/19 0344  WBC 7.9  HGB 12.4  HCT 37.5  MCV 91.7  PLT 034   Basic Metabolic Panel: Recent Labs  Lab 09/14/19 0344  NA 134*  K 3.3*  CL 96*  CO2 24  GLUCOSE 129*  BUN 18  CREATININE 1.28*  CALCIUM 9.2   GFR: Estimated Creatinine Clearance: 47.4 mL/min (A) (by C-G formula based on SCr of 1.28 mg/dL (H)). Liver Function Tests: Recent Labs  Lab 09/14/19 0344  AST 601*  ALT 376*  ALKPHOS 270*  BILITOT 3.4*  PROT 8.3*  ALBUMIN 3.1*   Recent Labs  Lab 09/14/19 0344  LIPASE 1,781*   No results for input(s): AMMONIA in the last 168 hours. Coagulation Profile: No results for input(s): INR, PROTIME in the last 168 hours. Cardiac Enzymes: No results for input(s): CKTOTAL, CKMB, CKMBINDEX, TROPONINI in the last 168 hours. BNP (last 3 results) No results for input(s): PROBNP in the last 8760 hours. HbA1C: No results for input(s): HGBA1C in the  last 72 hours. CBG: No results for input(s): GLUCAP in the last 168 hours. Lipid Profile: No results for input(s): CHOL, HDL, LDLCALC, TRIG, CHOLHDL, LDLDIRECT in the last 72 hours. Thyroid Function Tests: No results for input(s): TSH, T4TOTAL, FREET4, T3FREE, THYROIDAB in the last 72 hours. Anemia Panel: No results for input(s): VITAMINB12, FOLATE, FERRITIN, TIBC, IRON, RETICCTPCT in the last 72 hours. Urine analysis:    Component Value Date/Time   BILIRUBINUR NEG 06/24/2019 1216   PROTEINUR Positive (A) 06/24/2019 1216   UROBILINOGEN 0.2 06/24/2019 1216   NITRITE NEG 06/24/2019 1216   LEUKOCYTESUR Trace (A) 06/24/2019 1216   Sepsis Labs: No results found for this or any previous visit (from the past 240 hour(s)).   Radiological Exams on Admission: CT ABDOMEN PELVIS W CONTRAST  Result Date: 09/14/2019 CLINICAL DATA:  Abdominal abscess/infection suspected. EXAM: CT ABDOMEN AND PELVIS WITH CONTRAST TECHNIQUE: Multidetector CT imaging of the abdomen and pelvis was performed using the  standard protocol following bolus administration of intravenous contrast. CONTRAST:  141m OMNIPAQUE IOHEXOL 300 MG/ML  SOLN COMPARISON:  None. FINDINGS: Lower chest: Mild bibasilar atelectasis and or scarring. Hepatobiliary: No focal liver abnormality. Gallbladder walls appear thickened with surrounding pericholecystic fluid/edema. Gallstone measures approximately 1.6 cm. No common bile duct dilatation seen. Pancreas: Questionable subtle fluid stranding about the pancreatic body/tail. Spleen: Normal in size without focal abnormality. Adrenals/Urinary Tract: Adrenal glands appear normal. Hypodense masslike structure exophytic to the superior pole of the LEFT kidney, measuring 1.2 cm, not compatible with a simple cyst by density measurements. No renal stone or hydronephrosis bilaterally. No ureteral or bladder calculi identified. Bladder is unremarkable. Stomach/Bowel: No dilated large or small bowel loops. No evidence of bowel wall inflammation. Appendix is normal. Stomach is unremarkable, partially decompressed. Vascular/Lymphatic: No significant vascular findings are present. No enlarged abdominal or pelvic lymph nodes. Reproductive: Uterus and bilateral adnexa are unremarkable. Other: No abscess collection seen. No free intraperitoneal air. Musculoskeletal: Degenerative spondylosis of the lumbar spine, mild to moderate in degree. No acute or suspicious osseous finding. IMPRESSION: 1. Gallbladder walls appear thickened with surrounding pericholecystic fluid/edema. Findings are suspicious for acute cholecystitis. Recommend RIGHT upper quadrant ultrasound for further characterization. Gallstone measures approximately 1.6 cm. 2. Questionable subtle fluid stranding about the pancreatic body/tail. Recommend correlation with pancreatic enzymes to exclude concomitant acute pancreatitis. 3. **An incidental finding of potential clinical significance has been found. Hypodense masslike structure exophytic to the superior  pole of the LEFT kidney, measuring 1.2 cm, not compatible with a simple cyst by density measurements. This could represent a solid neoplastic mass or complex cyst. Recommend nonemergent renal MRI for further characterization.** 4. Remainder of the abdomen and pelvis CT is unremarkable. No bowel obstruction or evidence of bowel wall inflammation. No evidence of acute solid organ abnormality. No renal or ureteral calculi. Appendix is normal. Electronically Signed   By: SFranki CabotM.D.   On: 09/14/2019 06:58    EKG: Independently reviewed.  Sinus rhythm 87 bpm  Assessment/Plan Gallstone pancreatitis: Acute.  Patient presented with complaints of epigastric abdominal pain.  Labs significant for a lipase  1781, AST 601, ALT 376, alkaline phosphatase 270, total bilirubin 3.4.  CT scan of the abdomen and pelvis significant for gallbladder wall thickening along with fluid stranding around the pancreas.  GI had been consulted and was initially recommended for MRCP. -Admit to MedSurg bed -N.p.o. -Follow-up MRCP -Normal saline IV fluids at 150 mL/h -Continue Rocephin  -Antiemetics as needed -Morphine IV as needed for pain -Appreciate GI  and general surgery consultative services, will follow-up for any further recommendations  Renal insufficiency: Patient's baseline creatinine previously noted to be around 1.0, but she presents with creatinine elevated up to 1.24.  Urinalysis was positive for elevated specific gravity 0.2 the patient being dehydrated. -IV fluids as seen above -Hold nephrotoxic agents -Recheck creatinine in a.m.  Essential hypertension: Blood pressures currently stable.  Home blood pressure medications include triamterene hydrochlorothiazide 75-50 mg daily. -Hold blood pressure meds -IV as needed for elevated blood pressure  Diabetes mellitus type 2: Patient's initial blood glucose just mildly elevated 129.  Last hemoglobin A1c was 6.2 on 04/2019, but appears not to have been elevated  greater than 6.5 on records that are available.  Home medications include Metformin which she appears to be well controlled on. -Hold Metformin -During the start sliding scale of insulin if needed  Hypokalemia: Acute.  Initial potassium noted to be 3.3.   -Give 30 mEq of potassium chloride IV -Continue to monitor and replace as needed  Elevated LFT, hyperbilirubinemia: Acute.  As seen above. -Continue to monitor  Hyponatremia: Acute.  Sodium noted just mildly low at 134.  Suspect secondary to dehydration from recent nausea and vomiting with decreased p.o. intake. -Recheck sodium levels in a.m.  Masslike lesion of the left kidney: Seen incidentally on CT imaging noted to be 1.2 cm in the [place] pole of the left kidney. -Follow-up MRI  Hyperlipidemia: Home medications include simvastatin 20 mg nightly. -Held due to elevated liver enzyme  GERD -Continue Protonix  Obesity: BMI 37.79 kg/m  DVT prophylaxis: Lovenox Code Status: Full Family Communication: Son and sister updated over the phone per her request Disposition Plan: Likely discharge home in 2 to 3 days Consults called: GI Admission status: Inpatient  Norval Morton MD [facility] Pager [phone]   If 7PM-7AM, please contact night-coverage [email or web address] Password [facility]  09/14/2019, 8:33 AM

## 2019-09-14 NOTE — ED Notes (Signed)
Allison Shields, sister, [phone] would like an update when available

## 2019-09-14 NOTE — ED Notes (Signed)
Attempted to give report and was told nurse is in a room and will call back.

## 2019-09-14 NOTE — ED Provider Notes (Signed)
[facility] EMERGENCY DEPARTMENT Provider Note   CSN: [id number] Arrival date & time: 09/14/19  1829     History Chief Complaint  Patient presents with  . Back Pain  . Abdominal Pain    Allison Shields is a 64 y.o. female.  Patient is a 64 year old female with past medical history of diabetes, hypertension, hyperlipidemia, glaucoma.  She presents today for evaluation of abdominal pain.  Patient states she has had this for the past 4 days and is worsening.  She yesterday she reports vomiting.  She denies any bowel or bladder complaints.  She denies any fevers or chills.  Patient states pain is worse when she eats and radiates through to her back.  The history is provided by the patient.  Abdominal Pain Pain location:  Epigastric Pain quality: cramping   Pain radiates to:  Back Pain severity:  Moderate Onset quality:  Gradual Duration:  4 days Timing:  Constant Progression:  Worsening Chronicity:  New Relieved by:  Nothing Worsened by:  Movement, palpation and eating Ineffective treatments:  None tried      Past Medical History:  Diagnosis Date  . Dependent edema   . Diabetes mellitus   . Glaucoma   . Hyperlipidemia   . Hypertension   . Vitamin D deficiency     Patient Active Problem List   Diagnosis Date Noted  . Impaired glucose tolerance 08/29/2015  . Other and unspecified hyperlipidemia 01/04/2013  . Obesity, unspecified 01/04/2013  . Metabolic syndrome 93/71/6967  . Dependent edema 04/22/2011  . Glaucoma 04/22/2011  . Vitamin D deficiency 04/22/2011    Past Surgical History:  Procedure Laterality Date  . EYE SURGERY  4/08   glaucoma; right eye  . TUBAL LIGATION       OB History   No obstetric history on file.     Family History  Problem Relation Age of Onset  . Hypertension Mother   . Hyperlipidemia Mother   . Diabetes Father   . Stroke Father   . Cancer Sister   . Colon cancer Paternal Uncle   . Esophageal cancer Neg Hx    . Rectal cancer Neg Hx   . Stomach cancer Neg Hx     Social History   Tobacco Use  . Smoking status: Never Smoker  . Smokeless tobacco: Never Used  Substance Use Topics  . Alcohol use: Yes    Comment: social  . Drug use: No    Home Medications Prior to Admission medications   Medication Sig Start Date End Date Taking? Authorizing Provider  Blood Glucose Monitoring Suppl (TRUERESULT BLOOD GLUCOSE) W/DEVICE KIT USE AS DIRECTED 01/07/13   Elby Showers, MD  cholecalciferol (VITAMIN D) 1000 UNITS tablet Take 1,000 Units by mouth daily.      [provider]  cyclobenzaprine (FLEXERIL) 10 MG tablet TAKE 1/2 TO 1 TABLET BY MOUTH AT BEDTIME FOR BACK PAIN 05/07/16   Elby Showers, MD  cyclobenzaprine (FLEXERIL) 10 MG tablet TAKE 1/2 TABLET BY MOUTH AT BEDTIME 07/23/19   Elby Showers, MD  meloxicam (MOBIC) 15 MG tablet TAKE 1 TABLET BY MOUTH ONCE A DAY 07/23/19   Elby Showers, MD  metFORMIN (GLUCOPHAGE-XR) 500 MG 24 hr tablet Take 1 tablet (500 mg total) by mouth daily with breakfast. 05/17/19   Elby Showers, MD  pantoprazole (PROTONIX) 40 MG tablet Take 1 tablet (40 mg total) by mouth daily. 06/24/19   Elby Showers, MD  phentermine 37.5 MG  capsule Take 1 capsule (37.5 mg total) by mouth every morning. 01/25/19   Elby Showers, MD  simvastatin (ZOCOR) 20 MG tablet TAKE 1 TABLET BY MOUTH AT BEDTIME (STOP SIMVASTATIN 10MG) 05/17/19   Elby Showers, MD  triamterene-hydrochlorothiazide (MAXZIDE) 75-50 MG tablet Take 1 tablet by mouth daily. 05/17/19   Elby Showers, MD    Allergies    Patient has no known allergies.  Review of Systems   Review of Systems  Gastrointestinal: Positive for abdominal pain.  All other systems reviewed and are negative.   Physical Exam Updated Vital Signs BP 129/80 (BP Location: Left Arm)   Pulse 90   Temp 98.1 F (36.7 C) (Oral)   Resp 16   Ht 5' 1"  (1.549 m)   Wt 90.7 kg   SpO2 100%   BMI 37.79 kg/m   Physical Exam Vitals and nursing  note reviewed.  Constitutional:      General: She is not in acute distress.    Appearance: She is well-developed. She is not diaphoretic.  HENT:     Head: Normocephalic and atraumatic.  Cardiovascular:     Rate and Rhythm: Normal rate and regular rhythm.     Heart sounds: No murmur. No friction rub. No gallop.   Pulmonary:     Effort: Pulmonary effort is normal. No respiratory distress.     Breath sounds: Normal breath sounds. No wheezing.  Abdominal:     General: Bowel sounds are normal. There is no distension.     Palpations: Abdomen is soft.     Tenderness: There is abdominal tenderness in the epigastric area. There is no right CVA tenderness, left CVA tenderness, guarding or rebound.  Musculoskeletal:        General: Normal range of motion.     Cervical back: Normal range of motion and neck supple.  Skin:    General: Skin is warm and dry.  Neurological:     Mental Status: She is alert and oriented to person, place, and time.     ED Results / Procedures / Treatments   Labs (all labs ordered are listed, but only abnormal results are displayed) Labs Reviewed  CBC  LIPASE, BLOOD  COMPREHENSIVE METABOLIC PANEL  URINALYSIS, ROUTINE W REFLEX MICROSCOPIC    EKG EKG Interpretation  Date/Time:  Saturday September 14 2019 03:34:12 EDT Ventricular Rate:  87 PR Interval:  152 QRS Duration: 72 QT Interval:  354 QTC Calculation: 425 R Axis:   24 Text Interpretation: Normal sinus rhythm Nonspecific ST abnormality Abnormal ECG No prior ecg for comparison Confirmed by Veryl Speak [phone]) on 09/14/2019 4:26:20 AM   Radiology No results found.  Procedures Procedures (including critical care time)  Medications Ordered in ED Medications  sodium chloride flush (NS) 0.9 % injection 3 mL (has no administration in time range)  sodium chloride 0.9 % bolus 1,000 mL (has no administration in time range)  morphine 4 MG/ML injection 4 mg (has no administration in time range)  ondansetron  (ZOFRAN) injection 4 mg (has no administration in time range)    ED Course  I have reviewed the triage vital signs and the nursing notes.  Pertinent labs & imaging results that were available during my care of the patient were reviewed by me and considered in my medical decision making (see chart for details).    MDM Rules/Calculators/A&P  Patient presenting here with complaints of epigastric pain radiating through to her back.  This has been ongoing for the past several  days.  Patient is afebrile upon presentation with no white count, however her chemistries show a lipase of 1800 and elevation of her transaminases, alk phos, and total bili.  CT scan was obtained showing what appears to be acute cholecystitis, but no obvious dilatation of the bile duct.  Patient care discussed with Dr. Carlean Purl from gastroenterology.  It is his recommendation that the patient undergo MRCP to further evaluate her common bile duct and exclude a stone at this level.  This test will be ordered and patient will be admitted to the hospitalist service for pain control and further care.  Final Clinical Impression(s) / ED Diagnoses Final diagnoses:  None    Rx / DC Orders ED Discharge Orders    None       Veryl Speak, MD 09/14/19 [phone]

## 2019-09-14 NOTE — ED Triage Notes (Signed)
Pt arrives A&Ox 4 with c/o epigastric pain and back that started on this past Tuesday. Pt states vomitting x4 on yesterday. Pt states pain at 8/10; Pt describes pain as tight; Pt denies health issues.-Monique,RN

## 2019-09-15 DIAGNOSIS — K810 Acute cholecystitis: Secondary | ICD-10-CM

## 2019-09-15 LAB — COMPREHENSIVE METABOLIC PANEL
ALT: 215 U/L — ABNORMAL HIGH (ref 0–44)
AST: 125 U/L — ABNORMAL HIGH (ref 15–41)
Albumin: 2.3 g/dL — ABNORMAL LOW (ref 3.5–5.0)
Alkaline Phosphatase: 196 U/L — ABNORMAL HIGH (ref 38–126)
Anion gap: 14 (ref 5–15)
BUN: 15 mg/dL (ref 8–23)
CO2: 21 mmol/L — ABNORMAL LOW (ref 22–32)
Calcium: 8.2 mg/dL — ABNORMAL LOW (ref 8.9–10.3)
Chloride: 103 mmol/L (ref 98–111)
Creatinine, Ser: 1.2 mg/dL — ABNORMAL HIGH (ref 0.44–1.00)
GFR calc Af Amer: 56 mL/min — ABNORMAL LOW (ref >60)
GFR calc non Af Amer: 49 mL/min — ABNORMAL LOW (ref >60)
Glucose, Bld: 57 mg/dL — ABNORMAL LOW (ref 70–99)
Potassium: 3.5 mmol/L (ref 3.5–5.1)
Sodium: 138 mmol/L (ref 135–145)
Total Bilirubin: 0.8 mg/dL (ref 0.3–1.2)
Total Protein: 6.6 g/dL (ref 6.5–8.1)

## 2019-09-15 LAB — CBC
HCT: 33.7 % — ABNORMAL LOW (ref 36.0–46.0)
Hemoglobin: 10.7 g/dL — ABNORMAL LOW (ref 12.0–15.0)
MCH: 30.9 pg (ref 26.0–34.0)
MCHC: 31.8 g/dL (ref 30.0–36.0)
MCV: 97.4 fL (ref 80.0–100.0)
Platelets: 251 10*3/uL (ref 150–400)
RBC: 3.46 MIL/uL — ABNORMAL LOW (ref 3.87–5.11)
RDW: 13.1 % (ref 11.5–15.5)
WBC: 7.1 10*3/uL (ref 4.0–10.5)
nRBC: 0 % (ref 0.0–0.2)

## 2019-09-15 LAB — LIPASE, BLOOD: Lipase: 118 U/L — ABNORMAL HIGH (ref 11–51)

## 2019-09-15 NOTE — Progress Notes (Signed)
PROGRESS NOTE    Allison Shields  NGE:[id number] DOB: 05-26-1956 DOA: 09/14/2019 PCP: Elby Showers, MD   Brief Narrative:  HPI on 09/14/2019 by Dr. Fuller Plan Allison Shields is a 64 y.o. female with medical history significant of hypertension, hyperlipidemia, diabetes mellitus type 2 and glaucoma who presented with complaints of epigastric abdominal pain.  She has had intermittent abdominal pain over the last couple days for which she thought was due to reflux.  She would have nausea and vomiting and symptoms would resolve.  However, yesterday after eating at work she had acute onset of excruciating pain epigastrically that radiated to her back.  Denies having any fever, diarrhea, chest pain, shortness of breath.  Normally patient drinks alcohol possibly once a week on the weekend.  She still has her gallbladder and has not had anything similar to this happened previously in the past.  Assessment & Plan   Gallstone pancreatitis with elevated LFTs/hyperbilirubinemia -presented with epigastric abdominal pain -On admission, Lipase 1781, AST 601, ALT 376, bilirubin 3.4, alk phos 270-all have trended downward -GI consulted and appreciated -Continue IV fluids, n.p.o., IV pain medications and antiemetics as needed -IV Rocephin -MRCP discontinued -General surgery planning for lap cholecystectomy with IOC  Renal insufficiency -Baseline creatinine approximately 1, on admission was 1.24 -Creatinine down to 1.20 -Continue monitor BMP  Essential hypertension -Home medications of triamterene HCTZ held -BP currently stable, continue to monitor and add on IV medications as needed  Diabetes mellitus, type II -Hemoglobin A1c 6.2 in November 2020 -Metformin held  Hyponatremia -Resolved with IV fluids -Likely secondary to dehydration  Masslike lesion in the left kidney -Found incidentally on incidentally on CT scan  Hyperlipidemia -Statin currently held  GERD -Continue PPI  Obesity -BMI  greater than 35 -Patient to follow-up with PCP for lifestyle modification changes  DVT Prophylaxis Lovenox  Code Status: Full  Family Communication: None at bedside  Disposition Plan: Admitted from home for abdominal pain, found to have gallstone pancreatitis.  Pending surgical intervention.  Suspect disposition to home within a few days.  Consultants General surgery Gastroenterology  Procedures  None  Antibiotics   Anti-infectives (From admission, onward)   Start     Dose/Rate Route Frequency Ordered Stop   09/15/19 0900  cefTRIAXone (ROCEPHIN) 2 g in sodium chloride 0.9 % 100 mL IVPB     2 g 200 mL/hr over 30 Minutes Intravenous Every 24 hours 09/14/19 0853     09/14/19 0715  cefTRIAXone (ROCEPHIN) 2 g in sodium chloride 0.9 % 100 mL IVPB     2 g 200 mL/hr over 30 Minutes Intravenous  Once 09/14/19 4010 09/14/19 2725      Subjective:   Allison Shields seen and examined today.  Continues to have some right-sided abdominal pain, no nausea or vomiting at this time.  Would like to drink some water have ice chips.  Denies current chest pain, shortness breath, dizziness or headache.  Objective:   Vitals:   09/14/19 1400 09/14/19 1525 09/14/19 2127 09/15/19 0407  BP: 117/63 117/63 103/60 109/66  Pulse: 64 64 80 84  Resp: 16 16    Temp: 98.5 F (36.9 C) 98.5 F (36.9 C) 98.4 F (36.9 C) 98.4 F (36.9 C)  TempSrc: Oral Oral Oral Oral  SpO2: 100% 99% 95% 96%  Weight:      Height:        Intake/Output Summary (Last 24 hours) at 09/15/2019 1328 Last data filed at 09/14/2019 1500 Gross per 24 hour  Intake 537.07 ml  Output --  Net 537.07 ml   Filed Weights   09/14/19 0334  Weight: 90.7 kg    Exam  General: Well developed, well nourished, NAD, appears stated age  40: NCAT, mucous membranes moist.   Cardiovascular: S1 S2 auscultated, RRR  Respiratory: Clear to auscultation bilaterally with equal chest rise  Abdomen: Soft, mild right-sided tenderness,  nondistended, + bowel sounds  Extremities: warm dry without cyanosis clubbing or edema  Neuro: AAOx3, nonfocal  Psych: Normal affect and demeanor with intact judgement and insight   Data Reviewed: I have personally reviewed following labs and imaging studies  CBC: Recent Labs  Lab 09/14/19 0344 09/15/19 0606  WBC 7.9 7.1  HGB 12.4 10.7*  HCT 37.5 33.7*  MCV 91.7 97.4  PLT 299 726   Basic Metabolic Panel: Recent Labs  Lab 09/14/19 0344 09/15/19 0606  NA 134* 138  K 3.3* 3.5  CL 96* 103  CO2 24 21*  GLUCOSE 129* 57*  BUN 18 15  CREATININE 1.28* 1.20*  CALCIUM 9.2 8.2*   GFR: Estimated Creatinine Clearance: 50.5 mL/min (A) (by C-G formula based on SCr of 1.2 mg/dL (H)). Liver Function Tests: Recent Labs  Lab 09/14/19 0344 09/15/19 0606  AST 601* 125*  ALT 376* 215*  ALKPHOS 270* 196*  BILITOT 3.4* 0.8  PROT 8.3* 6.6  ALBUMIN 3.1* 2.3*   Recent Labs  Lab 09/14/19 0344 09/15/19 0606  LIPASE 1,781* 118*   No results for input(s): AMMONIA in the last 168 hours. Coagulation Profile: No results for input(s): INR, PROTIME in the last 168 hours. Cardiac Enzymes: No results for input(s): CKTOTAL, CKMB, CKMBINDEX, TROPONINI in the last 168 hours. BNP (last 3 results) No results for input(s): PROBNP in the last 8760 hours. HbA1C: No results for input(s): HGBA1C in the last 72 hours. CBG: No results for input(s): GLUCAP in the last 168 hours. Lipid Profile: No results for input(s): CHOL, HDL, LDLCALC, TRIG, CHOLHDL, LDLDIRECT in the last 72 hours. Thyroid Function Tests: No results for input(s): TSH, T4TOTAL, FREET4, T3FREE, THYROIDAB in the last 72 hours. Anemia Panel: No results for input(s): VITAMINB12, FOLATE, FERRITIN, TIBC, IRON, RETICCTPCT in the last 72 hours. Urine analysis:    Component Value Date/Time   COLORURINE YELLOW 09/14/2019 0903   APPEARANCEUR CLEAR 09/14/2019 0903   LABSPEC >1.046 (H) 09/14/2019 0903   PHURINE 5.0 09/14/2019 0903    GLUCOSEU NEGATIVE 09/14/2019 0903   HGBUR NEGATIVE 09/14/2019 0903   BILIRUBINUR NEGATIVE 09/14/2019 0903   BILIRUBINUR NEG 06/24/2019 1216   KETONESUR NEGATIVE 09/14/2019 0903   PROTEINUR NEGATIVE 09/14/2019 0903   UROBILINOGEN 0.2 06/24/2019 1216   NITRITE NEGATIVE 09/14/2019 0903   LEUKOCYTESUR NEGATIVE 09/14/2019 0903   Sepsis Labs: _0 (procalcitonin:4,lacticidven:4)  ) Recent Results (from the past 240 hour(s))  Respiratory Panel by RT PCR (Flu A&B, Covid) - Nasopharyngeal Swab     Status: None   Collection Time: 09/14/19  7:50 AM   Specimen: Nasopharyngeal Swab  Result Value Ref Range Status   SARS Coronavirus 2 by RT PCR NEGATIVE NEGATIVE Final    Comment: (NOTE) SARS-CoV-2 target nucleic acids are NOT DETECTED. The SARS-CoV-2 RNA is generally detectable in upper respiratoy specimens during the acute phase of infection. The lowest concentration of SARS-CoV-2 viral copies this assay can detect is 131 copies/mL. A negative result does not preclude SARS-Cov-2 infection and should not be used as the sole basis for treatment or other patient management decisions. A negative result may occur with  improper specimen collection/handling, submission of specimen other than nasopharyngeal swab, presence of viral mutation(s) within the areas targeted by this assay, and inadequate number of viral copies (<131 copies/mL). A negative result must be combined with clinical observations, patient history, and epidemiological information. The expected result is Negative. Fact Sheet for Patients:  PinkCheek.be Fact Sheet for Healthcare Providers:  GravelBags.it This test is not yet ap proved or cleared by the [place] FDA and  has been authorized for detection and/or diagnosis of SARS-CoV-2 by FDA under an Emergency Use Authorization (EUA). This EUA will remain  in effect (meaning this test can be used) for the duration  of the COVID-19 declaration under Section 564(b)(1) of the Act, 21 U.S.C. section 360bbb-3(b)(1), unless the authorization is terminated or revoked sooner.    Influenza A by PCR NEGATIVE NEGATIVE Final   Influenza B by PCR NEGATIVE NEGATIVE Final    Comment: (NOTE) The Xpert Xpress SARS-CoV-2/FLU/RSV assay is intended as an aid in  the diagnosis of influenza from Nasopharyngeal swab specimens and  should not be used as a sole basis for treatment. Nasal washings and  aspirates are unacceptable for Xpert Xpress SARS-CoV-2/FLU/RSV  testing. Fact Sheet for Patients: PinkCheek.be Fact Sheet for Healthcare Providers: GravelBags.it This test is not yet approved or cleared by the [place] FDA and  has been authorized for detection and/or diagnosis of SARS-CoV-2 by  FDA under an Emergency Use Authorization (EUA). This EUA will remain  in effect (meaning this test can be used) for the duration of the  Covid-19 declaration under Section 564(b)(1) of the Act, 21  U.S.C. section 360bbb-3(b)(1), unless the authorization is  terminated or revoked. Performed at [facility], [place] [address] [place], [place] [address]       Radiology Studies: CT ABDOMEN PELVIS W CONTRAST  Result Date: 09/14/2019 CLINICAL DATA:  Abdominal abscess/infection suspected. EXAM: CT ABDOMEN AND PELVIS WITH CONTRAST TECHNIQUE: Multidetector CT imaging of the abdomen and pelvis was performed using the standard protocol following bolus administration of intravenous contrast. CONTRAST:  132m OMNIPAQUE IOHEXOL 300 MG/ML  SOLN COMPARISON:  None. FINDINGS: Lower chest: Mild bibasilar atelectasis and or scarring. Hepatobiliary: No focal liver abnormality. Gallbladder walls appear thickened with surrounding pericholecystic fluid/edema. Gallstone measures approximately 1.6 cm. No common bile duct dilatation seen. Pancreas: Questionable subtle fluid stranding about the  pancreatic body/tail. Spleen: Normal in size without focal abnormality. Adrenals/Urinary Tract: Adrenal glands appear normal. Hypodense masslike structure exophytic to the superior pole of the LEFT kidney, measuring 1.2 cm, not compatible with a simple cyst by density measurements. No renal stone or hydronephrosis bilaterally. No ureteral or bladder calculi identified. Bladder is unremarkable. Stomach/Bowel: No dilated large or small bowel loops. No evidence of bowel wall inflammation. Appendix is normal. Stomach is unremarkable, partially decompressed. Vascular/Lymphatic: No significant vascular findings are present. No enlarged abdominal or pelvic lymph nodes. Reproductive: Uterus and bilateral adnexa are unremarkable. Other: No abscess collection seen. No free intraperitoneal air. Musculoskeletal: Degenerative spondylosis of the lumbar spine, mild to moderate in degree. No acute or suspicious osseous finding. IMPRESSION: 1. Gallbladder walls appear thickened with surrounding pericholecystic fluid/edema. Findings are suspicious for acute cholecystitis. Recommend RIGHT upper quadrant ultrasound for further characterization. Gallstone measures approximately 1.6 cm. 2. Questionable subtle fluid stranding about the pancreatic body/tail. Recommend correlation with pancreatic enzymes to exclude concomitant acute pancreatitis. 3. **An incidental finding of potential clinical significance has been found. Hypodense masslike structure exophytic to the superior pole of the LEFT kidney, measuring 1.2  cm, not compatible with a simple cyst by density measurements. This could represent a solid neoplastic mass or complex cyst. Recommend nonemergent renal MRI for further characterization.** 4. Remainder of the abdomen and pelvis CT is unremarkable. No bowel obstruction or evidence of bowel wall inflammation. No evidence of acute solid organ abnormality. No renal or ureteral calculi. Appendix is normal. Electronically Signed   By:  Franki Cabot M.D.   On: 09/14/2019 06:58     Scheduled Meds: . cyclobenzaprine  10 mg Oral QHS  . enoxaparin (LOVENOX) injection  40 mg Subcutaneous Q24H  . pantoprazole  40 mg Oral Daily  . sodium chloride flush  3 mL Intravenous Q12H   Continuous Infusions: . sodium chloride 150 mL/hr at 09/15/19 0055  . cefTRIAXone (ROCEPHIN)  IV 2 g (09/15/19 1001)     LOS: 1 day   Time Spent in minutes   45 minutes  Laneice Meneely D.O. on 09/15/2019 at 1:28 PM  Between 7am to 7pm - Please see pager noted on [email or web address]  After 7pm go to [email or web address]  And look for the night coverage person covering for me after hours  [facility] Office  [phone]

## 2019-09-15 NOTE — Progress Notes (Signed)
Assessment & Plan: HD#2 - biliary pancreatitis  NPO, IV hydration  Lipase improved to 118, transaminases improved  Persistent RUQ and right flank pain this AM  Allow sips of water, ice chips today.  Repeat labs in AM 4/5.  If labs improved and patient clinically improved, likely cholecystectomy tomorrow per Dr. Kae Heller.  Will enter pre-op orders.  Discussed with patient.        Armandina Gemma, MD       [facility]       Office: [phone]   Chief Complaint: Gallstone pancreatitis  Subjective: Patient in bed, comfortable.  Reports persistent right flank pain, no nausea or emesis.  Would like water.  Objective: Vital signs in last 24 hours: Temp:  [97.9 F (36.6 C)-98.5 F (36.9 C)] 98.4 F (36.9 C) (04/04 0407) Pulse Rate:  [62-84] 84 (04/04 0407) Resp:  [16-23] 16 (04/03 1525) BP: (98-117)/(60-74) 109/66 (04/04 0407) SpO2:  [95 %-100 %] 96 % (04/04 0407) Last BM Date: 09/14/19  Intake/Output from previous day: 04/03 0701 - 04/04 0700 In: 637.1 [I.V.:419.8; IV Piggyback:217.3] Out: -  Intake/Output this shift: No intake/output data recorded.  Physical Exam: HEENT - sclerae clear, mucous membranes moist Neck - soft Chest - clear bilaterally Cor - RRR Abdomen - soft without distension; mild tenderness RUQ; no mass Ext - no edema, non-tender Neuro - alert & oriented, no focal deficits  Lab Results:  Recent Labs    09/14/19 0344 09/15/19 0606  WBC 7.9 7.1  HGB 12.4 10.7*  HCT 37.5 33.7*  PLT 299 251   BMET Recent Labs    09/14/19 0344 09/15/19 0606  NA 134* 138  K 3.3* 3.5  CL 96* 103  CO2 24 21*  GLUCOSE 129* 57*  BUN 18 15  CREATININE 1.28* 1.20*  CALCIUM 9.2 8.2*   PT/INR No results for input(s): LABPROT, INR in the last 72 hours. Comprehensive Metabolic Panel:    Component Value Date/Time   NA 138 09/15/2019 0606   NA 134 (L) 09/14/2019 0344   K 3.5 09/15/2019 0606   K 3.3 (L) 09/14/2019 0344   CL 103 09/15/2019  0606   CL 96 (L) 09/14/2019 0344   CO2 21 (L) 09/15/2019 0606   CO2 24 09/14/2019 0344   BUN 15 09/15/2019 0606   BUN 18 09/14/2019 0344   CREATININE 1.20 (H) 09/15/2019 0606   CREATININE 1.28 (H) 09/14/2019 0344   CREATININE 1.02 (H) 05/03/2019 0931   CREATININE 1.09 (H) 01/25/2019 1405   GLUCOSE 57 (L) 09/15/2019 0606   GLUCOSE 129 (H) 09/14/2019 0344   CALCIUM 8.2 (L) 09/15/2019 0606   CALCIUM 9.2 09/14/2019 0344   AST 125 (H) 09/15/2019 0606   AST 601 (H) 09/14/2019 0344   ALT 215 (H) 09/15/2019 0606   ALT 376 (H) 09/14/2019 0344   ALKPHOS 196 (H) 09/15/2019 0606   ALKPHOS 270 (H) 09/14/2019 0344   BILITOT 0.8 09/15/2019 0606   BILITOT 3.4 (H) 09/14/2019 0344   PROT 6.6 09/15/2019 0606   PROT 8.3 (H) 09/14/2019 0344   ALBUMIN 2.3 (L) 09/15/2019 0606   ALBUMIN 3.1 (L) 09/14/2019 0344    Studies/Results: CT ABDOMEN PELVIS W CONTRAST  Result Date: 09/14/2019 CLINICAL DATA:  Abdominal abscess/infection suspected. EXAM: CT ABDOMEN AND PELVIS WITH CONTRAST TECHNIQUE: Multidetector CT imaging of the abdomen and pelvis was performed using the standard protocol following bolus administration of intravenous contrast. CONTRAST:  167mL OMNIPAQUE IOHEXOL 300 MG/ML  SOLN COMPARISON:  None.  FINDINGS: Lower chest: Mild bibasilar atelectasis and or scarring. Hepatobiliary: No focal liver abnormality. Gallbladder walls appear thickened with surrounding pericholecystic fluid/edema. Gallstone measures approximately 1.6 cm. No common bile duct dilatation seen. Pancreas: Questionable subtle fluid stranding about the pancreatic body/tail. Spleen: Normal in size without focal abnormality. Adrenals/Urinary Tract: Adrenal glands appear normal. Hypodense masslike structure exophytic to the superior pole of the LEFT kidney, measuring 1.2 cm, not compatible with a simple cyst by density measurements. No renal stone or hydronephrosis bilaterally. No ureteral or bladder calculi identified. Bladder is  unremarkable. Stomach/Bowel: No dilated large or small bowel loops. No evidence of bowel wall inflammation. Appendix is normal. Stomach is unremarkable, partially decompressed. Vascular/Lymphatic: No significant vascular findings are present. No enlarged abdominal or pelvic lymph nodes. Reproductive: Uterus and bilateral adnexa are unremarkable. Other: No abscess collection seen. No free intraperitoneal air. Musculoskeletal: Degenerative spondylosis of the lumbar spine, mild to moderate in degree. No acute or suspicious osseous finding. IMPRESSION: 1. Gallbladder walls appear thickened with surrounding pericholecystic fluid/edema. Findings are suspicious for acute cholecystitis. Recommend RIGHT upper quadrant ultrasound for further characterization. Gallstone measures approximately 1.6 cm. 2. Questionable subtle fluid stranding about the pancreatic body/tail. Recommend correlation with pancreatic enzymes to exclude concomitant acute pancreatitis. 3. **An incidental finding of potential clinical significance has been found. Hypodense masslike structure exophytic to the superior pole of the LEFT kidney, measuring 1.2 cm, not compatible with a simple cyst by density measurements. This could represent a solid neoplastic mass or complex cyst. Recommend nonemergent renal MRI for further characterization.** 4. Remainder of the abdomen and pelvis CT is unremarkable. No bowel obstruction or evidence of bowel wall inflammation. No evidence of acute solid organ abnormality. No renal or ureteral calculi. Appendix is normal. Electronically Signed   By: Franki Cabot M.D.   On: 09/14/2019 06:58      Armandina Gemma 09/15/2019  Patient ID: Allison Shields, female   DOB: Jun 10, 1956, 64 y.o.   MRN: [id number]

## 2019-09-15 NOTE — Plan of Care (Signed)

## 2019-09-16 ENCOUNTER — Inpatient Hospital Stay (HOSPITAL_COMMUNITY): Payer: 59 | Admitting: Certified Registered Nurse Anesthetist

## 2019-09-16 ENCOUNTER — Encounter (HOSPITAL_COMMUNITY): Payer: Self-pay | Admitting: Internal Medicine

## 2019-09-16 ENCOUNTER — Encounter (HOSPITAL_COMMUNITY)
Admission: EM | Disposition: A | Discharge status: Home / Self Care | Payer: Self-pay | Source: Home / Self Care | Attending: Internal Medicine

## 2019-09-16 HISTORY — PX: CHOLECYSTECTOMY: SHX55

## 2019-09-16 LAB — COMPREHENSIVE METABOLIC PANEL
ALT: 129 U/L — ABNORMAL HIGH (ref 0–44)
AST: 43 U/L — ABNORMAL HIGH (ref 15–41)
Albumin: 2.2 g/dL — ABNORMAL LOW (ref 3.5–5.0)
Alkaline Phosphatase: 152 U/L — ABNORMAL HIGH (ref 38–126)
Anion gap: 11 (ref 5–15)
BUN: 11 mg/dL (ref 8–23)
CO2: 22 mmol/L (ref 22–32)
Calcium: 8.2 mg/dL — ABNORMAL LOW (ref 8.9–10.3)
Chloride: 106 mmol/L (ref 98–111)
Creatinine, Ser: 1.02 mg/dL — ABNORMAL HIGH (ref 0.44–1.00)
GFR calc Af Amer: >60 mL/min (ref >60)
GFR calc non Af Amer: 59 mL/min — ABNORMAL LOW (ref >60)
Glucose, Bld: 56 mg/dL — ABNORMAL LOW (ref 70–99)
Potassium: 3.6 mmol/L (ref 3.5–5.1)
Sodium: 139 mmol/L (ref 135–145)
Total Bilirubin: 0.9 mg/dL (ref 0.3–1.2)
Total Protein: 6.1 g/dL — ABNORMAL LOW (ref 6.5–8.1)

## 2019-09-16 LAB — GLUCOSE, CAPILLARY
Glucose-Capillary: 142 mg/dL — ABNORMAL HIGH (ref 70–99)
Glucose-Capillary: 42 mg/dL — CL (ref 70–99)
Glucose-Capillary: 89 mg/dL (ref 70–99)
Glucose-Capillary: 103 mg/dL — ABNORMAL HIGH (ref 70–99)
Glucose-Capillary: 77 mg/dL (ref 70–99)
Glucose-Capillary: 124 mg/dL — ABNORMAL HIGH (ref 70–99)

## 2019-09-16 LAB — CBC
HCT: 32.6 % — ABNORMAL LOW (ref 36.0–46.0)
Hemoglobin: 10.4 g/dL — ABNORMAL LOW (ref 12.0–15.0)
MCH: 30.7 pg (ref 26.0–34.0)
MCHC: 31.9 g/dL (ref 30.0–36.0)
MCV: 96.2 fL (ref 80.0–100.0)
Platelets: 247 10*3/uL (ref 150–400)
RBC: 3.39 MIL/uL — ABNORMAL LOW (ref 3.87–5.11)
RDW: 12.9 % (ref 11.5–15.5)
WBC: 7.5 10*3/uL (ref 4.0–10.5)
nRBC: 0 % (ref 0.0–0.2)

## 2019-09-16 LAB — LIPASE, BLOOD: Lipase: 59 U/L — ABNORMAL HIGH (ref 11–51)

## 2019-09-16 SURGERY — LAPAROSCOPIC CHOLECYSTECTOMY
Anesthesia: General | Site: Abdomen

## 2019-09-16 MED ORDER — MIDAZOLAM HCL 2 MG/2ML IJ SOLN
INTRAMUSCULAR | Status: AC
  Filled 2019-09-16: qty 2

## 2019-09-16 MED ORDER — CHLORHEXIDINE GLUCONATE CLOTH 2 % EX PADS: 6.0000 | MEDICATED_PAD | Freq: Once | CUTANEOUS | Status: DC

## 2019-09-16 MED ORDER — 0.9 % SODIUM CHLORIDE (POUR BTL) OPTIME
TOPICAL | Status: DC | PRN
  Administered 2019-09-16: 10:00:00 1000 mL

## 2019-09-16 MED ORDER — STERILE WATER FOR IRRIGATION IR SOLN
Status: DC | PRN
  Administered 2019-09-16: 1000 mL

## 2019-09-16 MED ORDER — LIDOCAINE 2% (20 MG/ML) 5 ML SYRINGE
INTRAMUSCULAR | Status: DC | PRN
  Administered 2019-09-16: 100 mg via INTRAVENOUS

## 2019-09-16 MED ORDER — SODIUM CHLORIDE 0.9 % IR SOLN
Status: DC | PRN
  Administered 2019-09-16: 1000 mL

## 2019-09-16 MED ORDER — FENTANYL CITRATE (PF) 100 MCG/2ML IJ SOLN
INTRAMUSCULAR | Status: DC | PRN
  Administered 2019-09-16: 50 ug via INTRAVENOUS
  Administered 2019-09-16: 100 ug via INTRAVENOUS
  Administered 2019-09-16 (×2): 50 ug via INTRAVENOUS

## 2019-09-16 MED ORDER — SODIUM CHLORIDE 0.9 % IV SOLN
2.0000 g | INTRAVENOUS | Status: DC
  Filled 2019-09-16: qty 20

## 2019-09-16 MED ORDER — TRAMADOL HCL 50 MG PO TABS
50.0000 mg | ORAL_TABLET | Freq: Four times a day (QID) | ORAL | Status: DC | PRN
  Administered 2019-09-16 – 2019-09-17 (×3): 50 mg via ORAL
  Filled 2019-09-16 (×3): qty 1

## 2019-09-16 MED ORDER — SUGAMMADEX SODIUM 200 MG/2ML IV SOLN
INTRAVENOUS | Status: DC | PRN
  Administered 2019-09-16: 200 mg via INTRAVENOUS

## 2019-09-16 MED ORDER — ONDANSETRON HCL 4 MG/2ML IJ SOLN
INTRAMUSCULAR | Status: DC | PRN
  Administered 2019-09-16: 4 mg via INTRAVENOUS

## 2019-09-16 MED ORDER — SODIUM CHLORIDE 0.9 % IV SOLN: INTRAVENOUS | Status: DC

## 2019-09-16 MED ORDER — ESMOLOL HCL 100 MG/10ML IV SOLN
INTRAVENOUS | Status: AC
  Filled 2019-09-16: qty 10

## 2019-09-16 MED ORDER — ONDANSETRON HCL 4 MG/2ML IJ SOLN
INTRAMUSCULAR | Status: AC
  Filled 2019-09-16: qty 2

## 2019-09-16 MED ORDER — FENTANYL CITRATE (PF) 100 MCG/2ML IJ SOLN: 25.0000 ug | INTRAMUSCULAR | Status: DC | PRN

## 2019-09-16 MED ORDER — BUPIVACAINE HCL (PF) 0.25 % IJ SOLN
INTRAMUSCULAR | Status: AC
  Filled 2019-09-16: qty 30

## 2019-09-16 MED ORDER — PROPOFOL 10 MG/ML IV BOLUS
INTRAVENOUS | Status: AC
  Filled 2019-09-16: qty 40

## 2019-09-16 MED ORDER — LACTATED RINGERS IV SOLN: INTRAVENOUS | Status: DC

## 2019-09-16 MED ORDER — FENTANYL CITRATE (PF) 250 MCG/5ML IJ SOLN
INTRAMUSCULAR | Status: AC
  Filled 2019-09-16: qty 5

## 2019-09-16 MED ORDER — BUPIVACAINE HCL (PF) 0.25 % IJ SOLN
INTRAMUSCULAR | Status: DC | PRN
  Administered 2019-09-16: 23 mL

## 2019-09-16 MED ORDER — DEXAMETHASONE SODIUM PHOSPHATE 10 MG/ML IJ SOLN
INTRAMUSCULAR | Status: AC
  Filled 2019-09-16: qty 1

## 2019-09-16 MED ORDER — ACETAMINOPHEN 325 MG PO TABS
650.0000 mg | ORAL_TABLET | Freq: Four times a day (QID) | ORAL | Status: DC
  Administered 2019-09-16 – 2019-09-17 (×3): 650 mg via ORAL
  Filled 2019-09-16 (×3): qty 2

## 2019-09-16 MED ORDER — SUCCINYLCHOLINE CHLORIDE 200 MG/10ML IV SOSY
PREFILLED_SYRINGE | INTRAVENOUS | Status: DC | PRN
  Administered 2019-09-16: 120 mg via INTRAVENOUS

## 2019-09-16 MED ORDER — DEXTROSE 50 % IV SOLN
50.0000 mL | Freq: Once | INTRAVENOUS | Status: AC
  Administered 2019-09-16: 50 mL via INTRAVENOUS

## 2019-09-16 MED ORDER — MIDAZOLAM HCL 2 MG/2ML IJ SOLN
INTRAMUSCULAR | Status: DC | PRN
  Administered 2019-09-16: 2 mg via INTRAVENOUS

## 2019-09-16 MED ORDER — PROPOFOL 10 MG/ML IV BOLUS
INTRAVENOUS | Status: DC | PRN
  Administered 2019-09-16: 150 mg via INTRAVENOUS

## 2019-09-16 MED ORDER — ROCURONIUM BROMIDE 10 MG/ML (PF) SYRINGE
PREFILLED_SYRINGE | INTRAVENOUS | Status: DC | PRN
  Administered 2019-09-16: 10 mg via INTRAVENOUS
  Administered 2019-09-16: 50 mg via INTRAVENOUS

## 2019-09-16 MED ORDER — DEXAMETHASONE SODIUM PHOSPHATE 10 MG/ML IJ SOLN
INTRAMUSCULAR | Status: DC | PRN
  Administered 2019-09-16: 4 mg via INTRAVENOUS

## 2019-09-16 SURGICAL SUPPLY — 41 items
APPLIER CLIP 5 13 M/L LIGAMAX5 (MISCELLANEOUS) ×3
CANISTER SUCT 3000ML PPV (MISCELLANEOUS) ×3 IMPLANT
CHLORAPREP W/TINT 26 (MISCELLANEOUS) ×3 IMPLANT
CLIP APPLIE 5 13 M/L LIGAMAX5 (MISCELLANEOUS) ×2 IMPLANT
CNTNR URN SCR LID CUP LEK RST (MISCELLANEOUS) ×2 IMPLANT
CONT SPEC 4OZ STRL OR WHT (MISCELLANEOUS) ×3
COVER MAYO STAND STRL (DRAPES) IMPLANT
COVER SURGICAL LIGHT HANDLE (MISCELLANEOUS) ×3 IMPLANT
COVER WAND RF STERILE (DRAPES) IMPLANT
DERMABOND ADVANCED (GAUZE/BANDAGES/DRESSINGS) ×1
DERMABOND ADVANCED .7 DNX12 (GAUZE/BANDAGES/DRESSINGS) ×2 IMPLANT
DRAPE C-ARM 42X120 X-RAY (DRAPES) IMPLANT
ELECT REM PT RETURN 9FT ADLT (ELECTROSURGICAL) ×3
ELECTRODE REM PT RTRN 9FT ADLT (ELECTROSURGICAL) ×2 IMPLANT
ENDOLOOP SUT PDS II  0 18 (SUTURE) ×6
ENDOLOOP SUT PDS II 0 18 (SUTURE) ×4 IMPLANT
FILTER SMOKE EVAC LAPAROSHD (FILTER) ×3 IMPLANT
GLOVE BIO SURGEON STRL SZ 6 (GLOVE) ×3 IMPLANT
GLOVE INDICATOR 6.5 STRL GRN (GLOVE) ×3 IMPLANT
GOWN STRL REUS W/ TWL LRG LVL3 (GOWN DISPOSABLE) ×8 IMPLANT
GOWN STRL REUS W/TWL LRG LVL3 (GOWN DISPOSABLE) ×12
GRASPER SUT TROCAR 14GX15 (MISCELLANEOUS) ×3 IMPLANT
KIT BASIN OR (CUSTOM PROCEDURE TRAY) ×3 IMPLANT
KIT TURNOVER KIT B (KITS) ×3 IMPLANT
NEEDLE INSUFFLATION 14GA 120MM (NEEDLE) ×3 IMPLANT
NS IRRIG 1000ML POUR BTL (IV SOLUTION) ×3 IMPLANT
PAD ARMBOARD 7.5X6 YLW CONV (MISCELLANEOUS) ×3 IMPLANT
PENCIL SMOKE EVAC W/HOLSTER (ELECTROSURGICAL) IMPLANT
POUCH SPECIMEN RETRIEVAL 10MM (ENDOMECHANICALS) ×3 IMPLANT
SCISSORS LAP 5X35 DISP (ENDOMECHANICALS) ×3 IMPLANT
SET CHOLANGIOGRAPH 5 50 .035 (SET/KITS/TRAYS/PACK) IMPLANT
SET IRRIG TUBING LAPAROSCOPIC (IRRIGATION / IRRIGATOR) ×3 IMPLANT
SLEEVE ENDOPATH XCEL 5M (ENDOMECHANICALS) ×6 IMPLANT
SUT MNCRL AB 4-0 PS2 18 (SUTURE) ×6 IMPLANT
TOWEL GREEN STERILE (TOWEL DISPOSABLE) ×3 IMPLANT
TOWEL GREEN STERILE FF (TOWEL DISPOSABLE) ×3 IMPLANT
TRAY LAPAROSCOPIC MC (CUSTOM PROCEDURE TRAY) ×3 IMPLANT
TROCAR XCEL NON-BLD 11X100MML (ENDOMECHANICALS) ×3 IMPLANT
TROCAR XCEL NON-BLD 5MMX100MML (ENDOMECHANICALS) ×3 IMPLANT
TUBING LAP HI FLOW INSUFFLATIO (TUBING) ×3 IMPLANT
WATER STERILE IRR 1000ML POUR (IV SOLUTION) ×3 IMPLANT

## 2019-09-16 NOTE — Anesthesia Postprocedure Evaluation (Signed)
Anesthesia Post Note  Patient: Allison Shields  Procedure(s) Performed: LAPAROSCOPIC CHOLECYSTECTOMY (N/A Abdomen)     Patient location during evaluation: PACU Anesthesia Type: General Level of consciousness: awake Pain management: pain level controlled Vital Signs Assessment: post-procedure vital signs reviewed and stable Respiratory status: spontaneous breathing Cardiovascular status: blood pressure returned to baseline Postop Assessment: no apparent nausea or vomiting Anesthetic complications: no    Last Vitals:  Vitals:   09/16/19 1440 09/16/19 1800  BP: (!) 148/73 132/64  Pulse: 83 83  Resp: 18 18  Temp: 36.8 C 37.4 C  SpO2: 97% 95%    Last Pain:  Vitals:   09/16/19 1800  TempSrc: Oral  PainSc:                  Nadean Montanaro

## 2019-09-16 NOTE — Anesthesia Procedure Notes (Signed)
Procedure Name: Intubation Date/Time: 09/16/2019 9:17 AM Performed by: Leonor Liv, CRNA Pre-anesthesia Checklist: Patient identified, Emergency Drugs available, Suction available and Patient being monitored Patient Re-evaluated:Patient Re-evaluated prior to induction Oxygen Delivery Method: Circle System Utilized Preoxygenation: Pre-oxygenation with 100% oxygen Induction Type: IV induction and Rapid sequence Laryngoscope Size: Mac and 3 Grade View: Grade I Tube type: Oral Tube size: 7.0 mm Number of attempts: 1 Airway Equipment and Method: Stylet and Oral airway Placement Confirmation: ETT inserted through vocal cords under direct vision,  positive ETCO2 and breath sounds checked- equal and bilateral Secured at: 21 cm Tube secured with: Tape Dental Injury: Teeth and Oropharynx as per pre-operative assessment

## 2019-09-16 NOTE — Discharge Instructions (Signed)
[facility] LAPAROSCOPIC SURGERY: POST OP INSTRUCTIONS Always review your discharge instruction sheet given to you by the facility where your surgery was performed. IF YOU HAVE DISABILITY OR FAMILY LEAVE FORMS, YOU MUST BRING THEM TO THE OFFICE FOR PROCESSING.   DO NOT GIVE THEM TO YOUR DOCTOR.  PAIN CONTROL  1. First take acetaminophen (Tylenol) AND/or ibuprofen (Advil) to control your pain after surgery.  Follow directions on package.  Taking acetaminophen (Tylenol) and/or ibuprofen (Advil) regularly after surgery will help to control your pain and lower the amount of prescription pain medication you may need.  You should not take more than 3,000 mg (3 grams) of acetaminophen (Tylenol) in 24 hours.  You should not take ibuprofen (Advil), aleve, motrin, naprosyn or other NSAIDS if you have a history of stomach ulcers or chronic kidney disease.  2. A prescription for pain medication may be given to you upon discharge.  Take your pain medication as prescribed, if you still have uncontrolled pain after taking acetaminophen (Tylenol) or ibuprofen (Advil). 3. Use ice packs to help control pain. 4. If you need a refill on your pain medication, please contact your pharmacy.  They will contact our office to request authorization. Prescriptions will not be filled after 5pm or on week-ends.  HOME MEDICATIONS 5. Take your usually prescribed medications unless otherwise directed.  DIET 6. You should follow a light diet the first few days after arrival home.  Be sure to include lots of fluids daily. Avoid fatty, fried foods.   CONSTIPATION 7. It is common to experience some constipation after surgery and if you are taking pain medication.  Increasing fluid intake and taking a stool softener (such as Colace) will usually help or prevent this problem from occurring.  A mild laxative (Milk of Magnesia or Miralax) should be taken according to package instructions if there are no bowel  movements after 48 hours.  WOUND/INCISION CARE 8. Most patients will experience some swelling and bruising in the area of the incisions.  Ice packs will help.  Swelling and bruising can take several days to resolve.  9. Unless discharge instructions indicate otherwise, follow guidelines below  a. STERI-STRIPS - you may remove your outer bandages 48 hours after surgery, and you may shower at that time.  You have steri-strips (small skin tapes) in place directly over the incision.  These strips should be left on the skin for 7-10 days.   b. DERMABOND/SKIN GLUE - you may shower in 24 hours.  The glue will flake off over the next 2-3 weeks. 10. Any sutures or staples will be removed at the office during your follow-up visit.  ACTIVITIES 11. You may resume regular (light) daily activities beginning the next day--such as daily self-care, walking, climbing stairs--gradually increasing activities as tolerated.  You may have sexual intercourse when it is comfortable.  Refrain from any heavy lifting or straining until approved by your doctor. a. You may drive when you are no longer taking prescription pain medication, you can comfortably wear a seatbelt, and you can safely maneuver your car and apply brakes.  FOLLOW-UP 12. You should see your doctor in the office for a follow-up appointment approximately 2-3 weeks after your surgery.  You should have been given your post-op/follow-up appointment when your surgery was scheduled.  If you did not receive a post-op/follow-up appointment, make sure that you call for this appointment within a day or two after you arrive home to insure a convenient appointment time.     WHEN TO CALL YOUR DOCTOR: 1. Fever over 101.0 2. Inability to urinate 3. Continued bleeding from incision. 4. Increased pain, redness, or drainage from the incision. 5. Increasing abdominal pain  The clinic staff is available to answer your questions during regular business hours.  Please don't  hesitate to call and ask to speak to one of the nurses for clinical concerns.  If you have a medical emergency, go to the nearest emergency room or call 911.  A surgeon from [facility] is always on call at the hospital. [address], [address], [place], [place]  [address] ? [address] [address], , [place]   [address] [phone] ? [phone] ? FAX [phone] Web site: [email or web address]  .........   Managing Your Pain After Surgery Without Opioids    Thank you for participating in our program to help patients manage their pain after surgery without opioids. This is part of our effort to provide you with the best care possible, without exposing you or your family to the risk that opioids pose.  What pain can I expect after surgery? You can expect to have some pain after surgery. This is normal. The pain is typically worse the day after surgery, and quickly begins to get better. Many studies have found that many patients are able to manage their pain after surgery with Over-the-Counter (OTC) medications such as Tylenol and Motrin. If you have a condition that does not allow you to take Tylenol or Motrin, notify your surgical team.  How will I manage my pain? The best strategy for controlling your pain after surgery is around the clock pain control with Tylenol (acetaminophen) and Motrin (ibuprofen or Advil). Alternating these medications with each other allows you to maximize your pain control. In addition to Tylenol and Motrin, you can use heating pads or ice packs on your incisions to help reduce your pain.  How will I alternate your regular strength over-the-counter pain medication? You will take a dose of pain medication every three hours. ; Start by taking 650 mg of Tylenol (2 pills of 325 mg) ; 3 hours later take 600 mg of Motrin (3 pills of 200 mg) ; 3 hours after taking the Motrin take 650 mg of Tylenol ; 3 hours after that take 600 mg of  Motrin.   - 1 -  See example - if your first dose of Tylenol is at 12:00 PM   12:00 PM Tylenol 650 mg (2 pills of 325 mg)  3:00 PM Motrin 600 mg (3 pills of 200 mg)  6:00 PM Tylenol 650 mg (2 pills of 325 mg)  9:00 PM Motrin 600 mg (3 pills of 200 mg)  Continue alternating every 3 hours   We recommend that you follow this schedule around-the-clock for at least 3 days after surgery, or until you feel that it is no longer needed. Use the table on the last page of this handout to keep track of the medications you are taking. Important: Do not take more than 3000mg of Tylenol or 3200mg of Motrin in a 24-hour period. Do not take ibuprofen/Motrin if you have a history of bleeding stomach ulcers, severe kidney disease, &/or actively taking a blood thinner  What if I still have pain? If you have pain that is not controlled with the over-the-counter pain medications (Tylenol and Motrin or Advil) you might have what we call "breakthrough" pain. You will receive a prescription for a small amount of an opioid pain medication such as   Oxycodone, Tramadol, or Tylenol with Codeine. Use these opioid pills in the first 24 hours after surgery if you have breakthrough pain. Do not take more than 1 pill every 4-6 hours.  If you still have uncontrolled pain after using all opioid pills, don't hesitate to call our staff using the number provided. We will help make sure you are managing your pain in the best way possible, and if necessary, we can provide a prescription for additional pain medication.   Day 1    Time  Name of Medication Number of pills taken  Amount of Acetaminophen  Pain Level   Comments  AM PM       AM PM       AM PM       AM PM       AM PM       AM PM       AM PM       AM PM       Total Daily amount of Acetaminophen Do not take more than  3,000 mg per day      Day 2    Time  Name of Medication Number of pills taken  Amount of Acetaminophen  Pain Level   Comments  AM  PM       AM PM       AM PM       AM PM       AM PM       AM PM       AM PM       AM PM       Total Daily amount of Acetaminophen Do not take more than  3,000 mg per day      Day 3    Time  Name of Medication Number of pills taken  Amount of Acetaminophen  Pain Level   Comments  AM PM       AM PM       AM PM       AM PM          AM PM       AM PM       AM PM       AM PM       Total Daily amount of Acetaminophen Do not take more than  3,000 mg per day      Day 4    Time  Name of Medication Number of pills taken  Amount of Acetaminophen  Pain Level   Comments  AM PM       AM PM       AM PM       AM PM       AM PM       AM PM       AM PM       AM PM       Total Daily amount of Acetaminophen Do not take more than  3,000 mg per day      Day 5    Time  Name of Medication Number of pills taken  Amount of Acetaminophen  Pain Level   Comments  AM PM       AM PM       AM PM       AM PM       AM PM       AM PM       AM PM         AM PM       Total Daily amount of Acetaminophen Do not take more than  3,000 mg per day       Day 6    Time  Name of Medication Number of pills taken  Amount of Acetaminophen  Pain Level  Comments  AM PM       AM PM       AM PM       AM PM       AM PM       AM PM       AM PM       AM PM       Total Daily amount of Acetaminophen Do not take more than  3,000 mg per day      Day 7    Time  Name of Medication Number of pills taken  Amount of Acetaminophen  Pain Level   Comments  AM PM       AM PM       AM PM       AM PM       AM PM       AM PM       AM PM       AM PM       Total Daily amount of Acetaminophen Do not take more than  3,000 mg per day        For additional information about how and where to safely dispose of unused opioid medications - https://www.[employer]ful[employer].org  Disclaimer: This document contains information and/or instructional materials adapted from [facility]  for the typical patient with your condition. It does not replace medical advice from your health care provider because your experience may differ from that of the typical patient. Talk to your health care provider if you have any questions about this document, your condition or your treatment plan. Adapted from [facility]  

## 2019-09-16 NOTE — Op Note (Signed)
Operative Note  Allison Shields 64 y.o. female [id number]  09/16/2019  Surgeon: Clovis Riley MD FACS  Assistant: Barkley Boards PA-C  Procedure performed: Laparoscopic Cholecystectomy  Procedure classification: urgent  Preop diagnosis: gallstone pancreatitis Post-op diagnosis/intraop findings: same and acute on chronic cholecystitis  Specimens: gallbladder  Retained items: none  EBL: minimal  Complications: none  Description of procedure: After obtaining informed consent the patient was brought to the operating room. Antibiotics were administered. SCD's were applied. General endotracheal anesthesia was initiated and a formal time-out was performed. The abdomen was prepped and draped in the usual sterile fashion and the abdomen was entered using an infraumbilical veress needle after instilling the site with local. Insufflation to 37mmHg was obtained, 70mm trocar and camera inserted, and gross inspection revealed no evidence of injury from our entry or other intraabdominal abnormalities. There is omentum adherent to the midline and right abdominal wall up to the level of the umbilicus. Two 99mm trocars were introduced in the right midclavicular and right anterior axillary lines under direct visualization and following infiltration with local. An 46mm trocar was placed in the epigastrium. The gallbladder was retracted cephalad and the infundibulum was retracted laterally. The gallbladder was inflamed and edematous with thickened peritoneum. A combination of hook electrocautery and blunt dissection was utilized to clear the peritoneum from the neck and cystic duct, circumferentially isolating the cystic artery and cystic duct and lifting the gallbladder from the cystic plate. The critical view of safety was achieved with the cystic artery, cystic duct, and liver bed visualized between them with no other structures. The artery was clipped with two clips proximally and one distally and divided. The  cystic duct was very thickened and dilated and not amenable to clip ligation. The gallbladder was dissected from the liver plate using electrocautery. Once the gallbladder was freed and connected only to the patient via the cystic duct, two PDS endoloops were placed on the cystic duct proximally and it was sharply divided distally. The gallbladder was placed in an endocatch bag and removed through the epigastric trocar site. The gallbladder did tear during extraction spilling innumerable stones within the bag and on the skin. A small amount of bleeding on the liver bed was controlled with cautery. The right upper quadrant was irrigated copiously until the effluent was clear. Hemostasis was once again confirmed, and reinspection of the abdomen revealed no injuries. The endoloops were in good position without any bile leak from the duct or the liver bed. The 35mm trocar site in the epigastrium was closed with two 0 vicryls in the fascia under direct visualization using a PMI device. The abdomen was desufflated and all trocars removed. After irrigating the extraction port-site with betadine and saline, the skin incisions were closed with subcuticular monocryl and Dermabond. The patient was awakened, extubated and transported to the recovery room in stable condition.    All counts were correct at the completion of the case.

## 2019-09-16 NOTE — Progress Notes (Signed)
   Subjective/Chief Complaint: Abdominal pain improved. Having some back pain.    Objective: Vital signs in last 24 hours: Temp:  [98.2 F (36.8 C)] 98.2 F (36.8 C) (04/04 1534) Pulse Rate:  [63] 63 (04/04 1534) Resp:  [18] 18 (04/04 1534) BP: (101)/(58) 101/58 (04/04 1534) SpO2:  [97 %] 97 % (04/04 1534) Last BM Date: 09/14/19  Intake/Output from previous day: No intake/output data recorded. Intake/Output this shift: No intake/output data recorded.  General appearance: alert and cooperative Resp: unlabored GI: soft, non-tender; bowel sounds normal; no masses,  no organomegaly  Lab Results:  Recent Labs    09/15/19 0606 09/16/19 0451  WBC 7.1 7.5  HGB 10.7* 10.4*  HCT 33.7* 32.6*  PLT 251 247   BMET Recent Labs    09/15/19 0606 09/16/19 0451  NA 138 139  K 3.5 3.6  CL 103 106  CO2 21* 22  GLUCOSE 57* 56*  BUN 15 11  CREATININE 1.20* 1.02*  CALCIUM 8.2* 8.2*   PT/INR No results for input(s): LABPROT, INR in the last 72 hours. ABG No results for input(s): PHART, HCO3 in the last 72 hours.  Invalid input(s): PCO2, PO2  Studies/Results: No results found.  Anti-infectives: Anti-infectives (From admission, onward)   Start     Dose/Rate Route Frequency Ordered Stop   09/15/19 0900  cefTRIAXone (ROCEPHIN) 2 g in sodium chloride 0.9 % 100 mL IVPB     2 g 200 mL/hr over 30 Minutes Intravenous Every 24 hours 09/14/19 0853     09/14/19 0715  cefTRIAXone (ROCEPHIN) 2 g in sodium chloride 0.9 % 100 mL IVPB     2 g 200 mL/hr over 30 Minutes Intravenous  Once 09/14/19 [id number] 09/14/19 0824      Assessment/Plan: HD#3 - biliary pancreatitis  NPO, IV hydration  Lipase & LFTs continue to trend down, WBC normal, pain improved    I recommend proceeding with laparoscopic cholecystectomy with possible cholangiogram. Discussed risks of surgery including bleeding, pain, scarring, intraabdominal injury specifically to the common bile duct and sequelae, bile leak,  conversion to open surgery, blood clot, pneumonia, heart attack, stroke, failure to resolve symptoms, etc. Questions welcomed and answered. Will proceed to OR today.    LOS: 2 days    Allison Shields 09/16/2019

## 2019-09-16 NOTE — Progress Notes (Addendum)
PROGRESS NOTE    Allison Shields  MOL:[id number] DOB: 01/17/1956 DOA: 09/14/2019 PCP: Elby Showers, MD   Brief Narrative:  HPI on 09/14/2019 by Dr. Fuller Plan Allison Shields is a 64 y.o. female with medical history significant of hypertension, hyperlipidemia, diabetes mellitus type 2 and glaucoma who presented with complaints of epigastric abdominal pain.  She has had intermittent abdominal pain over the last couple days for which she thought was due to reflux.  She would have nausea and vomiting and symptoms would resolve.  However, yesterday after eating at work she had acute onset of excruciating pain epigastrically that radiated to her back.  Denies having any fever, diarrhea, chest pain, shortness of breath.  Normally patient drinks alcohol possibly once a week on the weekend.  She still has her gallbladder and has not had anything similar to this happened previously in the past.  Interim history [name] for gallstone pancreatitis, found to have elevated LFTs hyperbilirubinemia.  Surgery consulted and appreciated.  Planning for lap cholecystectomy today with possible IOC.  Assessment & Plan   Gallstone pancreatitis with elevated LFTs/hyperbilirubinemia -presented with epigastric abdominal pain -On admission, Lipase 1781, AST 601, ALT 376, bilirubin 3.4, alk phos 270 -lipase down to 59 today, AST 43, ALT 129, alk phos 152, total bilirubin 0.9 -GI consulted and appreciated -Continue IV fluids, n.p.o., IV pain medications and antiemetics as needed -IV Rocephin -MRCP discontinued -General surgery planning for lap cholecystectomy with IOC today  Renal insufficiency -Baseline creatinine approximately 1, on admission was 1.24 -Creatinine down to 1.02 -Continue monitor BMP  Essential hypertension -Home medications of triamterene HCTZ held -BP currently stable, continue to monitor and add on IV medications as needed  Diabetes mellitus, type II -Hemoglobin A1c 6.2 in November  2020 -Metformin held  Hyponatremia -Resolved with IV fluids -Likely secondary to dehydration  Masslike lesion in the left kidney -Found incidentally on incidentally on CT scan  Hyperlipidemia -Statin currently held  GERD -Continue PPI  Obesity -BMI greater than 35 -Patient to follow-up with PCP for lifestyle modification changes  DVT Prophylaxis Lovenox  Code Status: Full  Family Communication: None at bedside  Disposition Plan: Admitted from home for abdominal pain, found to have gallstone pancreatitis.  Pending surgical intervention.  Suspect disposition to home within 1-2 day.   Consultants General surgery Gastroenterology  Procedures  None  Antibiotics   Anti-infectives (From admission, onward)   Start     Dose/Rate Route Frequency Ordered Stop   09/15/19 0900  [MAR Hold]  cefTRIAXone (ROCEPHIN) 2 g in sodium chloride 0.9 % 100 mL IVPB     (MAR Hold since Mon 09/16/2019 at [place].Hold Reason: Transfer to a Procedural area.)   2 g 200 mL/hr over 30 Minutes Intravenous Every 24 hours 09/14/19 0853     09/14/19 0715  cefTRIAXone (ROCEPHIN) 2 g in sodium chloride 0.9 % 100 mL IVPB     2 g 200 mL/hr over 30 Minutes Intravenous  Once 09/14/19 2010 09/14/19 0712      Subjective:   Allison Shields seen and examined today.  Patient with no complaints of nausea or vomiting, abdominal pain this time.  Denies current chest pain or shortness of breath, dizziness or headache.  Looking forward to her surgery.    Objective:   Vitals:   09/14/19 1525 09/14/19 2127 09/15/19 0407 09/15/19 1534  BP: 117/63 103/60 109/66 (!) 101/58  Pulse: 64 80 84 63  Resp: 16   18  Temp: 98.5 F (36.9 C)  98.4 F (36.9 C) 98.4 F (36.9 C) 98.2 F (36.8 C)  TempSrc: Oral Oral Oral Oral  SpO2: 99% 95% 96% 97%  Weight:      Height:       No intake or output data in the 24 hours ending 09/16/19 0920 Filed Weights   09/14/19 0334  Weight: 90.7 kg   Exam  General: Well developed, well  nourished, NAD, appears stated age  1: NCAT, mucous membranes moist.   Cardiovascular: S1 S2 auscultated, RRR, no murmur  Respiratory: Clear to auscultation bilaterally with equal chest rise  Abdomen: Soft, nontender, nondistended, + bowel sounds  Extremities: warm dry without cyanosis clubbing or edema  Neuro: AAOx3, nonfocal  Psych: Pleasant, appropriate mood and affect  Data Reviewed: I have personally reviewed following labs and imaging studies  CBC: Recent Labs  Lab 09/14/19 0344 09/15/19 0606 09/16/19 0451  WBC 7.9 7.1 7.5  HGB 12.4 10.7* 10.4*  HCT 37.5 33.7* 32.6*  MCV 91.7 97.4 96.2  PLT 299 251 753   Basic Metabolic Panel: Recent Labs  Lab 09/14/19 0344 09/15/19 0606 09/16/19 0451  NA 134* 138 139  K 3.3* 3.5 3.6  CL 96* 103 106  CO2 24 21* 22  GLUCOSE 129* 57* 56*  BUN 18 15 11   CREATININE 1.28* 1.20* 1.02*  CALCIUM 9.2 8.2* 8.2*   GFR: Estimated Creatinine Clearance: 59.4 mL/min (A) (by C-G formula based on SCr of 1.02 mg/dL (H)). Liver Function Tests: Recent Labs  Lab 09/14/19 0344 09/15/19 0606 09/16/19 0451  AST 601* 125* 43*  ALT 376* 215* 129*  ALKPHOS 270* 196* 152*  BILITOT 3.4* 0.8 0.9  PROT 8.3* 6.6 6.1*  ALBUMIN 3.1* 2.3* 2.2*   Recent Labs  Lab 09/14/19 0344 09/15/19 0606 09/16/19 0451  LIPASE 1,781* 118* 59*   No results for input(s): AMMONIA in the last 168 hours. Coagulation Profile: No results for input(s): INR, PROTIME in the last 168 hours. Cardiac Enzymes: No results for input(s): CKTOTAL, CKMB, CKMBINDEX, TROPONINI in the last 168 hours. BNP (last 3 results) No results for input(s): PROBNP in the last 8760 hours. HbA1C: No results for input(s): HGBA1C in the last 72 hours. CBG: Recent Labs  Lab 09/16/19 0803 09/16/19 0827  GLUCAP 42* 142*   Lipid Profile: No results for input(s): CHOL, HDL, LDLCALC, TRIG, CHOLHDL, LDLDIRECT in the last 72 hours. Thyroid Function Tests: No results for input(s):  TSH, T4TOTAL, FREET4, T3FREE, THYROIDAB in the last 72 hours. Anemia Panel: No results for input(s): VITAMINB12, FOLATE, FERRITIN, TIBC, IRON, RETICCTPCT in the last 72 hours. Urine analysis:    Component Value Date/Time   COLORURINE YELLOW 09/14/2019 0903   APPEARANCEUR CLEAR 09/14/2019 0903   LABSPEC >1.046 (H) 09/14/2019 0903   PHURINE 5.0 09/14/2019 0903   GLUCOSEU NEGATIVE 09/14/2019 0903   HGBUR NEGATIVE 09/14/2019 0903   BILIRUBINUR NEGATIVE 09/14/2019 0903   BILIRUBINUR NEG 06/24/2019 1216   KETONESUR NEGATIVE 09/14/2019 0903   PROTEINUR NEGATIVE 09/14/2019 0903   UROBILINOGEN 0.2 06/24/2019 1216   NITRITE NEGATIVE 09/14/2019 0903   LEUKOCYTESUR NEGATIVE 09/14/2019 0903   Sepsis Labs: @LABRCNTIP (procalcitonin:4,lacticidven:4)  ) Recent Results (from the past 240 hour(s))  Respiratory Panel by RT PCR (Flu A&B, Covid) - Nasopharyngeal Swab     Status: None   Collection Time: 09/14/19  7:50 AM   Specimen: Nasopharyngeal Swab  Result Value Ref Range Status   SARS Coronavirus 2 by RT PCR NEGATIVE NEGATIVE Final    Comment: (NOTE) SARS-CoV-2 target nucleic acids  are NOT DETECTED. The SARS-CoV-2 RNA is generally detectable in upper respiratoy specimens during the acute phase of infection. The lowest concentration of SARS-CoV-2 viral copies this assay can detect is 131 copies/mL. A negative result does not preclude SARS-Cov-2 infection and should not be used as the sole basis for treatment or other patient management decisions. A negative result may occur with  improper specimen collection/handling, submission of specimen other than nasopharyngeal swab, presence of viral mutation(s) within the areas targeted by this assay, and inadequate number of viral copies (<131 copies/mL). A negative result must be combined with clinical observations, patient history, and epidemiological information. The expected result is Negative. Fact Sheet for Patients:    PinkCheek.be Fact Sheet for Healthcare Providers:  GravelBags.it This test is not yet ap proved or cleared by the [place] FDA and  has been authorized for detection and/or diagnosis of SARS-CoV-2 by FDA under an Emergency Use Authorization (EUA). This EUA will remain  in effect (meaning this test can be used) for the duration of the COVID-19 declaration under Section 564(b)(1) of the Act, 21 U.S.C. section 360bbb-3(b)(1), unless the authorization is terminated or revoked sooner.    Influenza A by PCR NEGATIVE NEGATIVE Final   Influenza B by PCR NEGATIVE NEGATIVE Final    Comment: (NOTE) The Xpert Xpress SARS-CoV-2/FLU/RSV assay is intended as an aid in  the diagnosis of influenza from Nasopharyngeal swab specimens and  should not be used as a sole basis for treatment. Nasal washings and  aspirates are unacceptable for Xpert Xpress SARS-CoV-2/FLU/RSV  testing. Fact Sheet for Patients: PinkCheek.be Fact Sheet for Healthcare Providers: GravelBags.it This test is not yet approved or cleared by the [place] FDA and  has been authorized for detection and/or diagnosis of SARS-CoV-2 by  FDA under an Emergency Use Authorization (EUA). This EUA will remain  in effect (meaning this test can be used) for the duration of the  Covid-19 declaration under Section 564(b)(1) of the Act, 21  U.S.C. section 360bbb-3(b)(1), unless the authorization is  terminated or revoked. Performed at [facility], [place] [address], [place], [place] [address]       Radiology Studies: No results found.   Scheduled Meds: . Chlorhexidine Gluconate Cloth  6 each Topical Once   And  . Chlorhexidine Gluconate Cloth  6 each Topical Once  . [MAR Hold] cyclobenzaprine  10 mg Oral QHS  . [MAR Hold] enoxaparin (LOVENOX) injection  40 mg Subcutaneous Q24H  . [MAR Hold] pantoprazole  40 mg  Oral Daily  . [MAR Hold] sodium chloride flush  3 mL Intravenous Q12H   Continuous Infusions: . sodium chloride 150 mL/hr at 09/15/19 2108  . [MAR Hold] cefTRIAXone (ROCEPHIN)  IV 2 g (09/15/19 1001)  . lactated ringers 10 mL/hr at 09/16/19 0831     LOS: 2 days   Time Spent in minutes   30 minutes  Torianne Laflam D.O. on 09/16/2019 at 9:20 AM  Between 7am to 7pm - Please see pager noted on [email or web address]  After 7pm go to [email or web address]  And look for the night coverage person covering for me after hours  [facility] Office  [phone]

## 2019-09-16 NOTE — Transfer of Care (Signed)
Immediate Anesthesia Transfer of Care Note  Patient: Allison Shields  Procedure(s) Performed: LAPAROSCOPIC CHOLECYSTECTOMY (N/A Abdomen)  Patient Location: PACU  Anesthesia Type:General  Level of Consciousness: drowsy and patient cooperative  Airway & Oxygen Therapy: Patient Spontanous Breathing and Patient connected to face mask oxygen  Post-op Assessment: Report given to RN, Post -op Vital signs reviewed and stable and Patient moving all extremities  Post vital signs: Reviewed and stable  Last Vitals:  Vitals Value Taken Time  BP 121/64 09/16/19 1109  Temp    Pulse 83 09/16/19 1112  Resp 18 09/16/19 1112  SpO2 100 % 09/16/19 1112  Vitals shown include unvalidated device data.  Last Pain:  Vitals:   09/16/19 1109  TempSrc:   PainSc: (P) Asleep      Patients Stated Pain Goal: 0 ([id number])  Complications: No apparent anesthesia complications

## 2019-09-16 NOTE — Anesthesia Preprocedure Evaluation (Addendum)
Anesthesia Evaluation  Patient identified by MRN, date of birth, ID band Patient awake    Reviewed: Allergy & Precautions, NPO status , Unable to perform ROS - Chart review only  Airway Mallampati: I  TM Distance: >3 FB Neck ROM: Full    Dental  (+) Edentulous Upper, Edentulous Lower, Dental Advisory Given   Pulmonary    Pulmonary exam normal        Cardiovascular hypertension, Pt. on medications Normal cardiovascular exam     Neuro/Psych    GI/Hepatic negative GI ROS, Neg liver ROS,   Endo/Other  diabetes, Type 2, Oral Hypoglycemic Agents  Renal/GU Renal disease     Musculoskeletal   Abdominal Normal abdominal exam  (+)   Peds  Hematology   Anesthesia Other Findings   Reproductive/Obstetrics                            Anesthesia Physical Anesthesia Plan  ASA: III  Anesthesia Plan: General   Post-op Pain Management:    Induction: Intravenous  PONV Risk Score and Plan: 3  Airway Management Planned: Oral ETT  Additional Equipment:   Intra-op Plan:   Post-operative Plan: Possible Post-op intubation/ventilation  Informed Consent:     Dental advisory given  Plan Discussed with: CRNA and Anesthesiologist  Anesthesia Plan Comments:         Anesthesia Quick Evaluation

## 2019-09-17 LAB — COMPREHENSIVE METABOLIC PANEL
ALT: 100 U/L — ABNORMAL HIGH (ref 0–44)
AST: 45 U/L — ABNORMAL HIGH (ref 15–41)
Albumin: 2 g/dL — ABNORMAL LOW (ref 3.5–5.0)
Alkaline Phosphatase: 123 U/L (ref 38–126)
Anion gap: 10 (ref 5–15)
BUN: 8 mg/dL (ref 8–23)
CO2: 20 mmol/L — ABNORMAL LOW (ref 22–32)
Calcium: 8 mg/dL — ABNORMAL LOW (ref 8.9–10.3)
Chloride: 105 mmol/L (ref 98–111)
Creatinine, Ser: 0.78 mg/dL (ref 0.44–1.00)
GFR calc Af Amer: >60 mL/min (ref >60)
GFR calc non Af Amer: >60 mL/min (ref >60)
Glucose, Bld: 104 mg/dL — ABNORMAL HIGH (ref 70–99)
Potassium: 3.7 mmol/L (ref 3.5–5.1)
Sodium: 135 mmol/L (ref 135–145)
Total Bilirubin: 0.9 mg/dL (ref 0.3–1.2)
Total Protein: 5.8 g/dL — ABNORMAL LOW (ref 6.5–8.1)

## 2019-09-17 LAB — CBC
HCT: 36.4 % (ref 36.0–46.0)
Hemoglobin: 11.6 g/dL — ABNORMAL LOW (ref 12.0–15.0)
MCH: 30.1 pg (ref 26.0–34.0)
MCHC: 31.9 g/dL (ref 30.0–36.0)
MCV: 94.5 fL (ref 80.0–100.0)
Platelets: 282 10*3/uL (ref 150–400)
RBC: 3.85 MIL/uL — ABNORMAL LOW (ref 3.87–5.11)
RDW: 12.8 % (ref 11.5–15.5)
WBC: 10.2 10*3/uL (ref 4.0–10.5)
nRBC: 0 % (ref 0.0–0.2)

## 2019-09-17 LAB — SURGICAL PATHOLOGY

## 2019-09-17 MED ORDER — TRAMADOL HCL 50 MG PO TABS: 50.0000 mg | ORAL_TABLET | Freq: Four times a day (QID) | ORAL | 0 refills | Status: DC | PRN

## 2019-09-17 MED FILL — traMADol HCL 50 MG TABS: 50 | 5 days supply | Qty: 20 | Fill #0

## 2019-09-17 NOTE — Progress Notes (Signed)
PROGRESS NOTE    GENIENE LIST  CWU:[id number] DOB: Feb 22, 1956 DOA: 09/14/2019 PCP: Elby Showers, MD   Brief Narrative:  HPI on 09/14/2019 by Dr. Fuller Plan Allison Shields is a 64 y.o. female with medical history significant of hypertension, hyperlipidemia, diabetes mellitus type 2 and glaucoma who presented with complaints of epigastric abdominal pain.  She has had intermittent abdominal pain over the last couple days for which she thought was due to reflux.  She would have nausea and vomiting and symptoms would resolve.  However, yesterday after eating at work she had acute onset of excruciating pain epigastrically that radiated to her back.  Denies having any fever, diarrhea, chest pain, shortness of breath.  Normally patient drinks alcohol possibly once a week on the weekend.  She still has her gallbladder and has not had anything similar to this happened previously in the past.  Interim history [name] for gallstone pancreatitis, found to have elevated LFTs hyperbilirubinemia.  Surgery consulted and appreciated, status post laparoscopic cholecystectomy.  Assessment & Plan   Gallstone pancreatitis with elevated LFTs/hyperbilirubinemia -presented with epigastric abdominal pain -On admission, Lipase 1781, AST 601, ALT 376, bilirubin 3.4, alk phos 270-all trending downward -GI consulted and appreciated, had recommended MRCP -Continue Rocephin and IV fluids, IV pain medications and antiemetics as needed -MRCP discontinued -General surgery consulted and appreciated, status post laparoscopic cholecystectomy -Currently on clear liquid diet  Renal insufficiency -Resolved -Baseline creatinine approximately 1, on admission was 1.24 -Creatinine down to 0.78 -Continue monitor BMP  Essential hypertension -Home medications of triamterene HCTZ held -BP currently stable, continue to monitor and add on IV medications as needed  Diabetes mellitus, type II -Hemoglobin A1c 6.2 in November  2020 -Metformin held  Hyponatremia -Resolved with IV fluids -Likely secondary to dehydration  Masslike lesion in the left kidney -Found incidentally on incidentally on CT scan  Hyperlipidemia -Statin currently held  GERD -Continue PPI  Obesity -BMI greater than 35 -Patient to follow-up with PCP for lifestyle modification changes  DVT Prophylaxis Lovenox  Code Status: Full  Family Communication: None at bedside  Disposition Plan: Admitted from home for abdominal pain, found to have gallstone pancreatitis.  Pending further general surgery recommendations.  Suspect diet will continue to be advanced slowly today.  Suspect home within 24 to 48 hours.   Consultants General surgery Gastroenterology  Procedures  Laparoscopic cholecystectomy  Antibiotics   Anti-infectives (From admission, onward)   Start     Dose/Rate Route Frequency Ordered Stop   09/16/19 0930  cefTRIAXone (ROCEPHIN) 2 g in sodium chloride 0.9 % 100 mL IVPB  Status:  Discontinued     2 g 200 mL/hr over 30 Minutes Intravenous To Surgery 09/16/19 0929 09/16/19 1207   09/15/19 0900  cefTRIAXone (ROCEPHIN) 2 g in sodium chloride 0.9 % 100 mL IVPB     2 g 200 mL/hr over 30 Minutes Intravenous Every 24 hours 09/14/19 0853     09/14/19 0715  cefTRIAXone (ROCEPHIN) 2 g in sodium chloride 0.9 % 100 mL IVPB     2 g 200 mL/hr over 30 Minutes Intravenous  Once 09/14/19 3888 09/14/19 2800      Subjective:   Meegan Shanafelt seen and examined today.  Patient with no complaints today.  Denies current nausea or vomiting.  Does feel some abdominal soreness.  Denies chest pain or shortness of breath, dizziness or headache.    Objective:   Vitals:   09/16/19 2100 09/17/19 0235 09/17/19 0553 09/17/19 0856  BP: 131/63  119/66 128/70 122/70  Pulse: 71 66 74 81  Resp: 16 15 16 17   Temp: 98.6 F (37 C) 98.9 F (37.2 C) 98.9 F (37.2 C) (!) 97.5 F (36.4 C)  TempSrc: Oral Oral Oral Oral  SpO2: 93% 94% 92% 95%  Weight:       Height:        Intake/Output Summary (Last 24 hours) at 09/17/2019 0900 Last data filed at 09/17/2019 0553 Gross per 24 hour  Intake 2420 ml  Output 10 ml  Net 2410 ml   Filed Weights   09/14/19 0334  Weight: 90.7 kg   Exam  General: Well developed, well nourished, NAD, appears stated age  50: NCAT, mucous membranes moist.   Cardiovascular: S1 S2 auscultated, RRR, no murmur  Respiratory: Clear to auscultation bilaterally with equal chest rise  Abdomen: Soft, appropriately tender, nondistended, + bowel sounds  Extremities: warm dry without cyanosis clubbing or edema  Neuro: AAOx3, nonfocal  Psych: Pleasant, appropriate mood and affect  Data Reviewed: I have personally reviewed following labs and imaging studies  CBC: Recent Labs  Lab 09/14/19 0344 09/15/19 0606 09/16/19 0451  WBC 7.9 7.1 7.5  HGB 12.4 10.7* 10.4*  HCT 37.5 33.7* 32.6*  MCV 91.7 97.4 96.2  PLT 299 251 956   Basic Metabolic Panel: Recent Labs  Lab 09/14/19 0344 09/15/19 0606 09/16/19 0451 09/17/19 0739  NA 134* 138 139 135  K 3.3* 3.5 3.6 3.7  CL 96* 103 106 105  CO2 24 21* 22 20*  GLUCOSE 129* 57* 56* 104*  BUN 18 15 11 8   CREATININE 1.28* 1.20* 1.02* 0.78  CALCIUM 9.2 8.2* 8.2* 8.0*   GFR: Estimated Creatinine Clearance: 75.8 mL/min (by C-G formula based on SCr of 0.78 mg/dL). Liver Function Tests: Recent Labs  Lab 09/14/19 0344 09/15/19 0606 09/16/19 0451 09/17/19 0739  AST 601* 125* 43* 45*  ALT 376* 215* 129* 100*  ALKPHOS 270* 196* 152* 123  BILITOT 3.4* 0.8 0.9 0.9  PROT 8.3* 6.6 6.1* 5.8*  ALBUMIN 3.1* 2.3* 2.2* 2.0*   Recent Labs  Lab 09/14/19 0344 09/15/19 0606 09/16/19 0451  LIPASE 1,781* 118* 59*   No results for input(s): AMMONIA in the last 168 hours. Coagulation Profile: No results for input(s): INR, PROTIME in the last 168 hours. Cardiac Enzymes: No results for input(s): CKTOTAL, CKMB, CKMBINDEX, TROPONINI in the last 168 hours. BNP (last 3  results) No results for input(s): PROBNP in the last 8760 hours. HbA1C: No results for input(s): HGBA1C in the last 72 hours. CBG: Recent Labs  Lab 09/16/19 0827 09/16/19 1015 09/16/19 1110 09/16/19 1216 09/16/19 1655  GLUCAP 142* 89 103* 77 124*   Lipid Profile: No results for input(s): CHOL, HDL, LDLCALC, TRIG, CHOLHDL, LDLDIRECT in the last 72 hours. Thyroid Function Tests: No results for input(s): TSH, T4TOTAL, FREET4, T3FREE, THYROIDAB in the last 72 hours. Anemia Panel: No results for input(s): VITAMINB12, FOLATE, FERRITIN, TIBC, IRON, RETICCTPCT in the last 72 hours. Urine analysis:    Component Value Date/Time   COLORURINE YELLOW 09/14/2019 0903   APPEARANCEUR CLEAR 09/14/2019 0903   LABSPEC >1.046 (H) 09/14/2019 0903   PHURINE 5.0 09/14/2019 0903   GLUCOSEU NEGATIVE 09/14/2019 0903   HGBUR NEGATIVE 09/14/2019 0903   BILIRUBINUR NEGATIVE 09/14/2019 0903   BILIRUBINUR NEG 06/24/2019 1216   KETONESUR NEGATIVE 09/14/2019 0903   PROTEINUR NEGATIVE 09/14/2019 0903   UROBILINOGEN 0.2 06/24/2019 1216   NITRITE NEGATIVE 09/14/2019 0903   LEUKOCYTESUR NEGATIVE 09/14/2019 2130  Sepsis Labs: @LABRCNTIP (procalcitonin:4,lacticidven:4)  ) Recent Results (from the past 240 hour(s))  Respiratory Panel by RT PCR (Flu A&B, Covid) - Nasopharyngeal Swab     Status: None   Collection Time: 09/14/19  7:50 AM   Specimen: Nasopharyngeal Swab  Result Value Ref Range Status   SARS Coronavirus 2 by RT PCR NEGATIVE NEGATIVE Final    Comment: (NOTE) SARS-CoV-2 target nucleic acids are NOT DETECTED. The SARS-CoV-2 RNA is generally detectable in upper respiratoy specimens during the acute phase of infection. The lowest concentration of SARS-CoV-2 viral copies this assay can detect is 131 copies/mL. A negative result does not preclude SARS-Cov-2 infection and should not be used as the sole basis for treatment or other patient management decisions. A negative result may occur with    improper specimen collection/handling, submission of specimen other than nasopharyngeal swab, presence of viral mutation(s) within the areas targeted by this assay, and inadequate number of viral copies (<131 copies/mL). A negative result must be combined with clinical observations, patient history, and epidemiological information. The expected result is Negative. Fact Sheet for Patients:  PinkCheek.be Fact Sheet for Healthcare Providers:  GravelBags.it This test is not yet ap proved or cleared by the [place] FDA and  has been authorized for detection and/or diagnosis of SARS-CoV-2 by FDA under an Emergency Use Authorization (EUA). This EUA will remain  in effect (meaning this test can be used) for the duration of the COVID-19 declaration under Section 564(b)(1) of the Act, 21 U.S.C. section 360bbb-3(b)(1), unless the authorization is terminated or revoked sooner.    Influenza A by PCR NEGATIVE NEGATIVE Final   Influenza B by PCR NEGATIVE NEGATIVE Final    Comment: (NOTE) The Xpert Xpress SARS-CoV-2/FLU/RSV assay is intended as an aid in  the diagnosis of influenza from Nasopharyngeal swab specimens and  should not be used as a sole basis for treatment. Nasal washings and  aspirates are unacceptable for Xpert Xpress SARS-CoV-2/FLU/RSV  testing. Fact Sheet for Patients: PinkCheek.be Fact Sheet for Healthcare Providers: GravelBags.it This test is not yet approved or cleared by the [place] FDA and  has been authorized for detection and/or diagnosis of SARS-CoV-2 by  FDA under an Emergency Use Authorization (EUA). This EUA will remain  in effect (meaning this test can be used) for the duration of the  Covid-19 declaration under Section 564(b)(1) of the Act, 21  U.S.C. section 360bbb-3(b)(1), unless the authorization is  terminated or revoked. Performed at  [facility], [place] [address] [place], [place] [address]       Radiology Studies: No results found.   Scheduled Meds: . acetaminophen  650 mg Oral Q6H  . cyclobenzaprine  10 mg Oral QHS  . enoxaparin (LOVENOX) injection  40 mg Subcutaneous Q24H  . pantoprazole  40 mg Oral Daily  . sodium chloride flush  3 mL Intravenous Q12H   Continuous Infusions: . sodium chloride 125 mL/hr at 09/16/19 2134  . cefTRIAXone (ROCEPHIN)  IV 2 g (09/17/19 0854)     LOS: 3 days   Time Spent in minutes   30 minutes  Solomia Harrell D.O. on 09/17/2019 at 9:00 AM  Between 7am to 7pm - Please see pager noted on [email or web address]  After 7pm go to [email or web address]  And look for the night coverage person covering for me after hours  [facility] Office  [phone]

## 2019-09-17 NOTE — Progress Notes (Signed)
[facility] Progress Note  1 Day Post-Op  Subjective: Patient reports abdomen is sore. Denies nausea or vomiting. +flatus. Tolerating CLD.   Objective: Vital signs in last 24 hours: Temp:  [97.5 F (36.4 C)-99.4 F (37.4 C)] 97.5 F (36.4 C) (04/06 0856) Pulse Rate:  [66-87] 81 (04/06 0856) Resp:  [15-18] 17 (04/06 0856) BP: (117-148)/(60-73) 122/70 (04/06 0856) SpO2:  [92 %-100 %] 95 % (04/06 0856) Last BM Date: 09/14/19  Intake/Output from previous day: 04/05 0701 - 04/06 0700 In: 2420 [P.O.:720; I.V.:1700] Out: 10 [Blood:10] Intake/Output this shift: No intake/output data recorded.  PE: General: pleasant, WD, WN female, sitting up in NAD Heart: regular, rate, and rhythm.  Normal s1,s2. No obvious murmurs, gallops, or rubs noted.  Palpable radial and pedal pulses bilaterally Lungs: CTAB, no wheezes, rhonchi, or rales noted.  Respiratory effort nonlabored Abd: soft, appropriately ttp, ND, +BS, no masses, hernias, or organomegaly, incisions c/d/i Psych: A&Ox3 with an appropriate affect.   Lab Results:  Recent Labs    09/15/19 0606 09/16/19 0451  WBC 7.1 7.5  HGB 10.7* 10.4*  HCT 33.7* 32.6*  PLT 251 247   BMET Recent Labs    09/16/19 0451 09/17/19 0739  NA 139 135  K 3.6 3.7  CL 106 105  CO2 22 20*  GLUCOSE 56* 104*  BUN 11 8  CREATININE 1.02* 0.78  CALCIUM 8.2* 8.0*   PT/INR No results for input(s): LABPROT, INR in the last 72 hours. CMP     Component Value Date/Time   NA 135 09/17/2019 0739   K 3.7 09/17/2019 0739   CL 105 09/17/2019 0739   CO2 20 (L) 09/17/2019 0739   GLUCOSE 104 (H) 09/17/2019 0739   BUN 8 09/17/2019 0739   CREATININE 0.78 09/17/2019 0739   CREATININE 1.02 (H) 05/03/2019 0931   CALCIUM 8.0 (L) 09/17/2019 0739   PROT 5.8 (L) 09/17/2019 0739   ALBUMIN 2.0 (L) 09/17/2019 0739   AST 45 (H) 09/17/2019 0739   ALT 100 (H) 09/17/2019 0739   ALKPHOS 123 09/17/2019 0739   BILITOT 0.9 09/17/2019 0739   GFRNONAA >60  09/17/2019 0739   GFRNONAA 60 05/03/2019 0931   GFRAA >60 09/17/2019 0739   GFRAA 69 05/03/2019 0931   Lipase     Component Value Date/Time   LIPASE 59 (H) 09/16/2019 0451       Studies/Results: No results found.  Anti-infectives: Anti-infectives (From admission, onward)   Start     Dose/Rate Route Frequency Ordered Stop   09/16/19 0930  cefTRIAXone (ROCEPHIN) 2 g in sodium chloride 0.9 % 100 mL IVPB  Status:  Discontinued     2 g 200 mL/hr over 30 Minutes Intravenous To Surgery 09/16/19 0929 09/16/19 1207   09/15/19 0900  cefTRIAXone (ROCEPHIN) 2 g in sodium chloride 0.9 % 100 mL IVPB     2 g 200 mL/hr over 30 Minutes Intravenous Every 24 hours 09/14/19 0853     09/14/19 0715  cefTRIAXone (ROCEPHIN) 2 g in sodium chloride 0.9 % 100 mL IVPB     2 g 200 mL/hr over 30 Minutes Intravenous  Once 09/14/19 [id number] 09/14/19 0824       Assessment/Plan HTN HLD T2DM  Gallstone pancreatitis S/p lap chole 09/16/19 Dr. Kae Heller - POD#1 - appropriately sore, incisions c/d/i - +flatus, tolerating CLD - advance to CM diet - stable for discharge from a surgery standpoint - follow up arranged, Rx for pain medication sent, no further abx needed  FEN: CM diet  VTE: SCDs, lovenox ID: Rocephin 4/3>4/6  LOS: 3 days    Brigid Re , [facility] 09/17/2019, 9:33 AM Please see Amion for pager number during day hours 7:00am-4:30pm

## 2019-09-17 NOTE — Consult Note (Signed)
   [facility] Inpatient Consult   09/17/2019  Allison Shields [birth year] [id number]     Patient is currently in pending status for post hospital follow up  with [facility] for chronic disease management services.  Patient will be outreached by a [place] Coordinator for the [place] for follow up support and benefit needs.  Patient will receive a post hospital call and will be evaluated for assessments and disease process education. Call attempt to reach patient in hospital room, phone is currently ringing busy.    Plan: Patient will be followed by [place] Coordinator.   For additional questions or referrals please contact:   Natividad Brood, RN BSN [facility] Liaison  [phone] business mobile phone Toll free office [phone]  Fax number: [phone] [email or web address]

## 2019-09-17 NOTE — Discharge Summary (Signed)
Physician Discharge Summary  Allison Shields [id number] DOB: 11-06-1955 DOA: 09/14/2019  PCP: Elby Showers, MD  Admit date: 09/14/2019 Discharge date: 09/17/2019  Time spent: 45 minutes  Recommendations for Outpatient Follow-up:  Patient will be discharged to home.  Patient will need to follow up with primary care provider within one week of discharge.  Follow up with general surgery. Patient should continue medications as prescribed.  Patient should follow a heart healthy/carb modified diet.   Discharge Diagnoses:  Gallstone pancreatitis with elevated LFTs/hyperbilirubinemia Renal insufficiency Essential hypertension Diabetes mellitus, type II Hyponatremia Masslike lesion in the left kidney Hyperlipidemia GERD Obesity  Discharge Condition: Stable  Diet recommendation: heart healthy/carb modified  Filed Weights   09/14/19 0334  Weight: 90.7 kg    History of present illness:  on 09/14/2019 by Dr. Bari Edward Bulowis a 64 y.o.femalewith medical history significant ofhypertension, hyperlipidemia, diabetes mellitus type 2 and glaucoma who presented with complaints of epigastric abdominal pain.She has had intermittent abdominal pain over the last couple days for which she thought was due to reflux. She would have nausea and vomiting and symptoms would resolve. However, yesterday after eating at work she had acute onset of excruciating pain epigastrically that radiated to her back. Denies having any fever, diarrhea, chest pain, shortness of breath.Normally patient drinks alcohol possibly once a week on the weekend. She still has her gallbladder and has not had anything similar to this happened previously in the past.  Hospital Course:  Gallstone pancreatitis with elevated LFTs/hyperbilirubinemia -presented with epigastric abdominal pain -On admission, Lipase 1781, AST 601, ALT 376, bilirubin 3.4, alk phos 270-all trending downward -GI consulted and appreciated,  had recommended MRCP -was placed on Rocephin and IV fluids, IV pain medications and antiemetics as needed -MRCP discontinued -General surgery consulted and appreciated, status post laparoscopic cholecystectomy -Diet advanced and patient tolerated well  Renal insufficiency -Resolved -Baseline creatinine approximately 1, on admission was 1.24 -Creatinine down to 0.78  Essential hypertension -Home medications of triamterene HCTZ held- but may resume on discharge  Diabetes mellitus, type II -Hemoglobin A1c 6.2 in November 2020 -Metformin held- resume on discharge   Hyponatremia -Resolved with IV fluids -Likely secondary to dehydration  Masslike lesion in the left kidney -Found incidentally on incidentally on CT scan  Hyperlipidemia -Statin currently held- resume on discharge   GERD -Continue PPI  Obesity -BMI greater than 35 -Patient to follow-up with PCP for lifestyle modification changes  Consultants General surgery Gastroenterology  Procedures  Laparoscopic cholecystectomy  Discharge Exam: Vitals:   09/17/19 0553 09/17/19 0856  BP: 128/70 122/70  Pulse: 74 81  Resp: 16 17  Temp: 98.9 F (37.2 C) (!) 97.5 F (36.4 C)  SpO2: 92% 95%    Exam  General: Well developed, well nourished, NAD, appears stated age  HEENT: NCAT, mucous membranes moist.   Cardiovascular: S1 S2 auscultated, RRR, no murmur  Respiratory: Clear to auscultation bilaterally with equal chest rise  Abdomen: Soft, appropriately tender, nondistended, + bowel sounds  Extremities: warm dry without cyanosis clubbing or edema  Neuro: AAOx3, nonfocal  Psych: Pleasant, appropriate mood and affect  Discharge Instructions Discharge Instructions    Discharge instructions   Complete by: As directed    Patient will be discharged to home.  Patient will need to follow up with primary care provider within one week of discharge.  Follow up with general surgery. Patient should continue  medications as prescribed.  Patient should follow a heart healthy/carb modified diet.  Allergies as of 09/17/2019   No Known Allergies     Medication List    STOP taking these medications   phentermine 37.5 MG capsule     TAKE these medications   cholecalciferol 1000 units tablet Commonly known as: VITAMIN D Take 1,000 Units by mouth daily.   cyclobenzaprine 10 MG tablet Commonly known as: FLEXERIL TAKE 1/2 TABLET BY MOUTH AT BEDTIME What changed:   how much to take  how to take this  when to take this  additional instructions  Another medication with the same name was removed. Continue taking this medication, and follow the directions you see here.   meloxicam 15 MG tablet Commonly known as: MOBIC TAKE 1 TABLET BY MOUTH ONCE A DAY   metFORMIN 500 MG 24 hr tablet Commonly known as: GLUCOPHAGE-XR Take 1 tablet (500 mg total) by mouth daily with breakfast.   pantoprazole 40 MG tablet Commonly known as: Protonix Take 1 tablet (40 mg total) by mouth daily.   simvastatin 20 MG tablet Commonly known as: ZOCOR TAKE 1 TABLET BY MOUTH AT BEDTIME (STOP SIMVASTATIN 10MG) What changed:   how much to take  how to take this  when to take this  additional instructions   traMADol 50 MG tablet Commonly known as: ULTRAM Take 1 tablet (50 mg total) by mouth every 6 (six) hours as needed for moderate pain.   triamterene-hydrochlorothiazide 75-50 MG tablet Commonly known as: MAXZIDE Take 1 tablet by mouth daily.   TRUEresult Blood Glucose w/Device Kit USE AS DIRECTED      No Known Allergies Follow-up Information    [facility], [facility]. Go on 10/08/2019.   Specialty: General Surgery Why: Follow up appointment scheduled for 9:45 AM. Please arrive 30 min prior to appointment time. Bring photo ID and insurance information.  Contact information: [place] [address] [phone]        Elby Showers, MD. Schedule an appointment  as soon as possible for a visit in 1 week(s).   Specialty: Internal Medicine Why: Hosptial follow up Contact information: [address] [place] [id number] [phone]            The results of significant diagnostics from this hospitalization (including imaging, microbiology, ancillary and laboratory) are listed below for reference.    Significant Diagnostic Studies: CT ABDOMEN PELVIS W CONTRAST  Result Date: 09/14/2019 CLINICAL DATA:  Abdominal abscess/infection suspected. EXAM: CT ABDOMEN AND PELVIS WITH CONTRAST TECHNIQUE: Multidetector CT imaging of the abdomen and pelvis was performed using the standard protocol following bolus administration of intravenous contrast. CONTRAST:  141m OMNIPAQUE IOHEXOL 300 MG/ML  SOLN COMPARISON:  None. FINDINGS: Lower chest: Mild bibasilar atelectasis and or scarring. Hepatobiliary: No focal liver abnormality. Gallbladder walls appear thickened with surrounding pericholecystic fluid/edema. Gallstone measures approximately 1.6 cm. No common bile duct dilatation seen. Pancreas: Questionable subtle fluid stranding about the pancreatic body/tail. Spleen: Normal in size without focal abnormality. Adrenals/Urinary Tract: Adrenal glands appear normal. Hypodense masslike structure exophytic to the superior pole of the LEFT kidney, measuring 1.2 cm, not compatible with a simple cyst by density measurements. No renal stone or hydronephrosis bilaterally. No ureteral or bladder calculi identified. Bladder is unremarkable. Stomach/Bowel: No dilated large or small bowel loops. No evidence of bowel wall inflammation. Appendix is normal. Stomach is unremarkable, partially decompressed. Vascular/Lymphatic: No significant vascular findings are present. No enlarged abdominal or pelvic lymph nodes. Reproductive: Uterus and bilateral adnexa are unremarkable. Other: No abscess collection seen. No  free intraperitoneal air. Musculoskeletal: Degenerative spondylosis of  the lumbar spine, mild to moderate in degree. No acute or suspicious osseous finding. IMPRESSION: 1. Gallbladder walls appear thickened with surrounding pericholecystic fluid/edema. Findings are suspicious for acute cholecystitis. Recommend RIGHT upper quadrant ultrasound for further characterization. Gallstone measures approximately 1.6 cm. 2. Questionable subtle fluid stranding about the pancreatic body/tail. Recommend correlation with pancreatic enzymes to exclude concomitant acute pancreatitis. 3. **An incidental finding of potential clinical significance has been found. Hypodense masslike structure exophytic to the superior pole of the LEFT kidney, measuring 1.2 cm, not compatible with a simple cyst by density measurements. This could represent a solid neoplastic mass or complex cyst. Recommend nonemergent renal MRI for further characterization.** 4. Remainder of the abdomen and pelvis CT is unremarkable. No bowel obstruction or evidence of bowel wall inflammation. No evidence of acute solid organ abnormality. No renal or ureteral calculi. Appendix is normal. Electronically Signed   By: Franki Cabot M.D.   On: 09/14/2019 06:58    Microbiology: Recent Results (from the past 240 hour(s))  Respiratory Panel by RT PCR (Flu A&B, Covid) - Nasopharyngeal Swab     Status: None   Collection Time: 09/14/19  7:50 AM   Specimen: Nasopharyngeal Swab  Result Value Ref Range Status   SARS Coronavirus 2 by RT PCR NEGATIVE NEGATIVE Final    Comment: (NOTE) SARS-CoV-2 target nucleic acids are NOT DETECTED. The SARS-CoV-2 RNA is generally detectable in upper respiratoy specimens during the acute phase of infection. The lowest concentration of SARS-CoV-2 viral copies this assay can detect is 131 copies/mL. A negative result does not preclude SARS-Cov-2 infection and should not be used as the sole basis for treatment or other patient management decisions. A negative result may occur with  improper specimen  collection/handling, submission of specimen other than nasopharyngeal swab, presence of viral mutation(s) within the areas targeted by this assay, and inadequate number of viral copies (<131 copies/mL). A negative result must be combined with clinical observations, patient history, and epidemiological information. The expected result is Negative. Fact Sheet for Patients:  PinkCheek.be Fact Sheet for Healthcare Providers:  GravelBags.it This test is not yet ap proved or cleared by the [place] FDA and  has been authorized for detection and/or diagnosis of SARS-CoV-2 by FDA under an Emergency Use Authorization (EUA). This EUA will remain  in effect (meaning this test can be used) for the duration of the COVID-19 declaration under Section 564(b)(1) of the Act, 21 U.S.C. section 360bbb-3(b)(1), unless the authorization is terminated or revoked sooner.    Influenza A by PCR NEGATIVE NEGATIVE Final   Influenza B by PCR NEGATIVE NEGATIVE Final    Comment: (NOTE) The Xpert Xpress SARS-CoV-2/FLU/RSV assay is intended as an aid in  the diagnosis of influenza from Nasopharyngeal swab specimens and  should not be used as a sole basis for treatment. Nasal washings and  aspirates are unacceptable for Xpert Xpress SARS-CoV-2/FLU/RSV  testing. Fact Sheet for Patients: PinkCheek.be Fact Sheet for Healthcare Providers: GravelBags.it This test is not yet approved or cleared by the [place] FDA and  has been authorized for detection and/or diagnosis of SARS-CoV-2 by  FDA under an Emergency Use Authorization (EUA). This EUA will remain  in effect (meaning this test can be used) for the duration of the  Covid-19 declaration under Section 564(b)(1) of the Act, 21  U.S.C. section 360bbb-3(b)(1), unless the authorization is  terminated or revoked. Performed at [facility], [place],  [place] [address]      Labs: Basic Metabolic Panel: Recent Labs  Lab 09/14/19 0344 09/15/19 0606 09/16/19 0451 09/17/19 0739  NA 134* 138 139 135  K 3.3* 3.5 3.6 3.7  CL 96* 103 106 105  CO2 24 21* 22 20*  GLUCOSE 129* 57* 56* 104*  BUN 18 15 11 8   CREATININE 1.28* 1.20* 1.02* 0.78  CALCIUM 9.2 8.2* 8.2* 8.0*   Liver Function Tests: Recent Labs  Lab 09/14/19 0344 09/15/19 0606 09/16/19 0451 09/17/19 0739  AST 601* 125* 43* 45*  ALT 376* 215* 129* 100*  ALKPHOS 270* 196* 152* 123  BILITOT 3.4* 0.8 0.9 0.9  PROT 8.3* 6.6 6.1* 5.8*  ALBUMIN 3.1* 2.3* 2.2* 2.0*   Recent Labs  Lab 09/14/19 0344 09/15/19 0606 09/16/19 0451  LIPASE 1,781* 118* 59*   No results for input(s): AMMONIA in the last 168 hours. CBC: Recent Labs  Lab 09/14/19 0344 09/15/19 0606 09/16/19 0451  WBC 7.9 7.1 7.5  HGB 12.4 10.7* 10.4*  HCT 37.5 33.7* 32.6*  MCV 91.7 97.4 96.2  PLT 299 251 247   Cardiac Enzymes: No results for input(s): CKTOTAL, CKMB, CKMBINDEX, TROPONINI in the last 168 hours. BNP: BNP (last 3 results) No results for input(s): BNP in the last 8760 hours.  ProBNP (last 3 results) No results for input(s): PROBNP in the last 8760 hours.  CBG: Recent Labs  Lab 09/16/19 0827 09/16/19 1015 09/16/19 1110 09/16/19 1216 09/16/19 1655  GLUCAP 142* 89 103* 77 124*       Signed:  Lafreda Casebeer  [facility] 09/17/2019, 9:49 AM

## 2019-09-17 NOTE — Plan of Care (Signed)
  Problem: Pain Managment: Goal: General experience of comfort will improve Outcome: Progressing   Problem: Safety: Goal: Ability to remain free from injury will improve Outcome: Progressing   Problem: Clinical Measurements: Goal: Postoperative complications will be avoided or minimized Outcome: Progressing   Problem: Skin Integrity: Goal: Demonstration of wound healing without infection will improve Outcome: Progressing

## 2019-09-18 ENCOUNTER — Telehealth: Payer: Self-pay

## 2019-09-18 NOTE — Telephone Encounter (Signed)
Transition Care Management Follow-up Telephone Call  Date of discharge and from where: 09/17/2019  How have you been since you were released from the hospital? [facility]  Any questions or concerns? Yes   Items Reviewed:  Did the pt receive and understand the discharge instructions provided? Yes   Medications obtained and verified? Yes   Any new allergies since your discharge? Yes   Dietary orders reviewed? Yes  Do you have support at home? Yes   Functional Questionnaire: (I = Independent and D = Dependent) ADLs: I    Bathing/Dressing- I  Meal Prep- I  Eating- I  Maintaining continence- I  Transferring/Ambulation- I  Managing Meds- I  Follow up appointments reviewed:   PCP Hospital f/u appt confirmed? Yes  Scheduled to see 09/27/2019 at 12:00pm.  [facility] f/u appt confirmed? Yes  Scheduled to see 10/08/2019 [facility]   Are transportation arrangements needed? Yes   If their condition worsens, is the pt aware to call PCP or go to the Emergency Dept.? Yes  Was the patient provided with contact information for the PCP's office or ED? Yes  Was to pt encouraged to call back with questions or concerns? Yes

## 2019-09-19 ENCOUNTER — Encounter: Payer: Self-pay | Admitting: *Deleted

## 2019-09-19 ENCOUNTER — Other Ambulatory Visit: Payer: Self-pay | Admitting: *Deleted

## 2019-09-19 NOTE — Patient Outreach (Signed)
[facility]) Care Management  09/19/2019  Allison Shields Aug 24, 1955 [id number]    Transition of care telephone call  Referral received:09/16/19 Initial outreach:09/19/19 Insurance: [facility]   Initial unsuccessful telephone call to patient's preferred number in order to complete transition of care assessment; no answer, left HIPAA compliant voicemail message requesting return call.   Objective: Per electronic record   was hospitalized at [facility]  From 4/3-09/17/19 for Acute Cholecystitis , Laparoscopic cholecystectomy on 09/16/19  Comorbidities include: Glaucoma   She was discharged to home on 4/6/21without the need for home health services or DME.  Plan: This RNCM will route unsuccessful outreach letter with [place] [facility] pamphlet and 24 hour Nurse Advice Line Magnet to [place] Management clinical pool to be mailed to patient's home address. This RNCM will attempt another outreach within 4 business days.   Joylene Draft, RN, BSN  [place] Management Coordinator  [phone]- Mobile [phone]- Toll Free Main Office

## 2019-09-19 NOTE — Patient Outreach (Signed)
[facility]) Care Management  09/19/2019  Allison Shields 16-May-1956 [id number]   Transition of care call/case closure   Referral received:09/16/19 Initial outreach:09/19/19 Insurance: [employer] [employer]    Subjective:  Incoming return call from patient unsuccessful outreach earlier today, 2 HIPAA identifiers verified. Explained purpose of call and completed transition of care assessment.  She states that she doing pretty good,  denies post-operative problems, says surgical incisions are unremarkable, no redness or drainage, denies temperature elevation, states surgical pain well managed with prescribed medications. She states tolerating diet, eating softer foods, denies nausea. She reports tolerating mobility in home, She states passing gas but has not had bowel movement yet,reviewed discharge instructions on management of constipation. She denies having difficulty with urinating .Marland KitchenShe reports tolerating mobility in home.   Patient mom and other family  are assisting with her recovery.   Discussed patient accessing [place] Benefits.  She discussed ongoing medical condition diabetes  elevation  and hypertension , introduced program benefits she states she does not  a referral to one of the [place] chronic disease management programs that she is doing good taking care of self and going to all my doctor appointments . She discussed having meter and checking blood sugars in the past, but not recently, offered additional education on Diabetes education material she declines stating that she has information.  She does not have the hospital indemnity She does  uses a Ambulance person, at [facility].   Discussed  staying safe during the COVID 19 pandemic.    Objective:  Per electronic record  was hospitalized [facility]  From 4/3-09/17/19 for Acute Cholecystitis , Laparoscopic cholecystectomy on 09/16/19  Comorbidities include: Glaucoma,  hyperlipidemia, impaired glucose control   She was discharged to home on 4/6/21without the need for home health servicesor DME.  Assessment:  Patient voices good understanding of all discharge instructions.  See transition of care flowsheet for assessment details.   Plan:  Reviewed hospital discharge diagnosis of Laparoscopic Cholecystectomy    and discharge treatment plan using hospital discharge instructions, assessing medication adherence, reviewing problems requiring provider notification, and discussing the importance of follow up with surgeon, primary care provider and/or specialists as directed. Reviewed  healthy lifestyle program information to receive discounted premium for  2022  Step 1: Get annual physical between June 13, 2018 and December 12, 2019; Step 2: Complete your health assessment between June 14, 2019 and February 12, 2020 at [email or web address] Step 3:Identify your current health status and complete the corresponding action step between January 1, and February 12, 2020.    No ongoing care management needs identified so will close case to [place] Management services and patient has already been sent unsuccessful outreach letter with [facility] care management information earlier today.     Joylene Draft, RN, BSN  [place] Management Coordinator  [phone]- Mobile [phone]- Toll Free Main Office

## 2019-09-23 ENCOUNTER — Telehealth: Payer: Self-pay | Admitting: Internal Medicine

## 2019-09-23 NOTE — Telephone Encounter (Signed)
Patient advised to call surgeon

## 2019-09-23 NOTE — Telephone Encounter (Signed)
Patient had laparoscopic Cholecystectomy April 5th. Now complaining of constipation. Is passing gas but no BM since surgery. Patient advised to call Cr. Connor's office and ask for nurse or Dr. Kae Heller.

## 2019-09-24 ENCOUNTER — Ambulatory Visit: Payer: 59 | Admitting: *Deleted

## 2019-09-27 ENCOUNTER — Ambulatory Visit
Admission: RE | Admit: 2019-09-27 | Discharge: 2019-09-27 | Disposition: A | Discharge status: Home / Self Care | Payer: 59 | Source: Ambulatory Visit | Attending: Internal Medicine | Admitting: Internal Medicine

## 2019-09-27 ENCOUNTER — Ambulatory Visit: Payer: 59 | Admitting: Internal Medicine

## 2019-09-27 ENCOUNTER — Encounter: Payer: Self-pay | Admitting: Internal Medicine

## 2019-09-27 ENCOUNTER — Other Ambulatory Visit: Payer: Self-pay

## 2019-09-27 VITALS — BP 120/80 | HR 90 | Temp 97.9°F | Ht 63.0 in | Wt 192.0 lb

## 2019-09-27 DIAGNOSIS — Z9049 Acquired absence of other specified parts of digestive tract: Secondary | ICD-10-CM | POA: Diagnosis not present

## 2019-09-27 DIAGNOSIS — K5900 Constipation, unspecified: Secondary | ICD-10-CM

## 2019-09-27 DIAGNOSIS — R7302 Impaired glucose tolerance (oral): Secondary | ICD-10-CM

## 2019-09-27 DIAGNOSIS — K851 Biliary acute pancreatitis without necrosis or infection: Secondary | ICD-10-CM

## 2019-09-27 DIAGNOSIS — E7800 Pure hypercholesterolemia, unspecified: Secondary | ICD-10-CM

## 2019-09-27 DIAGNOSIS — R7989 Other specified abnormal findings of blood chemistry: Secondary | ICD-10-CM | POA: Diagnosis not present

## 2019-09-27 NOTE — Progress Notes (Signed)
   Subjective:    Patient ID: Allison Shields, female    DOB: [birth year], 64 y.o.   MRN: [id number]  HPI 64 year old Female here for hospitalization follow up.  She called on April 12 complaining of constipation.  I had her call general surgeon about this and they recommended Dulcolax for her.  She is feeling less uncomfortable and bloated.  She had laparoscopic cholecystectomy by Dr. Romana Juniper on April 5 after presenting to the emergency department April 3 with epigastric abdominal pain.  Lipase was 1781.  SGOT was 601 SGPT was 376 and bilirubin was 3.4.  Alkaline phosphatase was 270  Today, white blood cell count is normal at 7900, hemoglobin normal at 13 g and platelet count is elevated at 547,000.  Lipase mildly elevated at 95 but significantly better than on presentation April 3.  Creatinine is elevated 2.43 but patient was fasting and therefore slightly volume depleted when she came for labs.  She will need to return when well hydrated for repeat BUN and creatinine.  Now SGOT is 45 and SGPT is 100.  Alkaline phosphatase is normal.  Bilirubin is normal.  Overall, it seems the patient is improving although she has had some issues with constipation recently.  She will have KUB flat and upright abdominal film today.  Review of Systems see above she has no vomiting     Objective:   Physical Exam Blood pressure is 120/80 BMI is 34.01 pulse 90 pulse oximetry 99% weight 192 pounds.  She is in no acute distress today.  Abdomen: No significant tenderness and small laparoscopic incisions are healing without evidence of infection.  Chest is clear to auscultation.  Cardiac exam regular rate and rhythm normal S1 and S2.  No lower extremity edema.       Assessment & Plan:  Status post laparoscopic cholecystectomy for acute cholecystitis with gallstone pancreatitis  Constipation status post surgery  Plan: She has follow-up appointment with surgeon in the very near future.  Labs drawn today.  She  has had no liquids since before midnight.  She has elevated serum creatinine.  This will need to be repeated when she is well hydrated next week.  She was contacted about this.  Continue to use Dulcolax on a as needed basis and try to walk some which may help constipation.

## 2019-09-28 LAB — COMPLETE METABOLIC PANEL WITH GFR
AG Ratio: 0.8 (calc) — ABNORMAL LOW (ref 1.0–2.5)
ALT: 14 U/L (ref 6–29)
AST: 14 U/L (ref 10–35)
Albumin: 3.9 g/dL (ref 3.6–5.1)
Alkaline phosphatase (APISO): 126 U/L (ref 37–153)
BUN/Creatinine Ratio: 10 (calc) (ref 6–22)
BUN: 25 mg/dL (ref 7–25)
CO2: 21 mmol/L (ref 20–32)
Calcium: 10.1 mg/dL (ref 8.6–10.4)
Chloride: 98 mmol/L (ref 98–110)
Creat: 2.43 mg/dL — ABNORMAL HIGH (ref 0.50–0.99)
GFR, Est African American: 24 mL/min/{1.73_m2} — ABNORMAL LOW (ref >=60)
GFR, Est Non African American: 21 mL/min/{1.73_m2} — ABNORMAL LOW (ref >=60)
Globulin: 5 g/dL (calc) — ABNORMAL HIGH (ref 1.9–3.7)
Glucose, Bld: 108 mg/dL — ABNORMAL HIGH (ref 65–99)
Potassium: 4.4 mmol/L (ref 3.5–5.3)
Sodium: 135 mmol/L (ref 135–146)
Total Bilirubin: 0.4 mg/dL (ref 0.2–1.2)
Total Protein: 8.9 g/dL — ABNORMAL HIGH (ref 6.1–8.1)

## 2019-09-28 LAB — CBC WITH DIFFERENTIAL/PLATELET
Absolute Monocytes: 577 cells/uL (ref 200–950)
Basophils Absolute: 47 cells/uL (ref 0–200)
Basophils Relative: 0.6 %
Eosinophils Absolute: 119 cells/uL (ref 15–500)
Eosinophils Relative: 1.5 %
HCT: 39.4 % (ref 35.0–45.0)
Hemoglobin: 13 g/dL (ref 11.7–15.5)
Lymphs Abs: 2331 cells/uL (ref 850–3900)
MCH: 30.5 pg (ref 27.0–33.0)
MCHC: 33 g/dL (ref 32.0–36.0)
MCV: 92.5 fL (ref 80.0–100.0)
MPV: 10.1 fL (ref 7.5–12.5)
Monocytes Relative: 7.3 %
Neutro Abs: 4827 cells/uL (ref 1500–7800)
Neutrophils Relative %: 61.1 %
Platelets: 547 10*3/uL — ABNORMAL HIGH (ref 140–400)
RBC: 4.26 10*6/uL (ref 3.80–5.10)
RDW: 11.9 % (ref 11.0–15.0)
Total Lymphocyte: 29.5 %
WBC: 7.9 10*3/uL (ref 3.8–10.8)

## 2019-09-28 LAB — LIPASE: Lipase: 95 U/L — ABNORMAL HIGH (ref 7–60)

## 2019-09-29 NOTE — Patient Instructions (Signed)
It appears that your lab studies have improved status post surgery.  Continue to take it easy.  Walk some which will help constipation.  Use Dulcolax on an as-needed basis.  Your serum creatinine is elevated and needs to be repeated when you are well-hydrated.

## 2019-10-01 ENCOUNTER — Other Ambulatory Visit: Payer: Self-pay

## 2019-10-01 ENCOUNTER — Other Ambulatory Visit: Payer: 59 | Admitting: Internal Medicine

## 2019-10-01 DIAGNOSIS — R7989 Other specified abnormal findings of blood chemistry: Secondary | ICD-10-CM

## 2019-10-01 LAB — CREATININE, SERUM: Creat: 1.82 mg/dL — ABNORMAL HIGH (ref 0.50–0.99)

## 2019-10-28 DIAGNOSIS — H4031X3 Glaucoma secondary to eye trauma, right eye, severe stage: Secondary | ICD-10-CM | POA: Diagnosis not present

## 2019-10-28 DIAGNOSIS — H25013 Cortical age-related cataract, bilateral: Secondary | ICD-10-CM | POA: Diagnosis not present

## 2019-11-01 ENCOUNTER — Other Ambulatory Visit: Payer: Self-pay

## 2019-11-01 ENCOUNTER — Encounter: Payer: Self-pay | Admitting: Internal Medicine

## 2019-11-01 ENCOUNTER — Ambulatory Visit: Payer: 59 | Admitting: Internal Medicine

## 2019-11-01 VITALS — BP 102/80 | HR 68 | Temp 98.0°F | Ht 63.0 in | Wt 196.0 lb

## 2019-11-01 DIAGNOSIS — K5900 Constipation, unspecified: Secondary | ICD-10-CM

## 2019-11-01 DIAGNOSIS — Z6834 Body mass index (BMI) 34.0-34.9, adult: Secondary | ICD-10-CM | POA: Diagnosis not present

## 2019-11-01 DIAGNOSIS — R7302 Impaired glucose tolerance (oral): Secondary | ICD-10-CM

## 2019-11-01 DIAGNOSIS — Z9049 Acquired absence of other specified parts of digestive tract: Secondary | ICD-10-CM | POA: Diagnosis not present

## 2019-11-01 DIAGNOSIS — R7989 Other specified abnormal findings of blood chemistry: Secondary | ICD-10-CM | POA: Diagnosis not present

## 2019-11-01 LAB — POCT URINALYSIS DIPSTICK
Appearance: NEGATIVE
Bilirubin, UA: NEGATIVE
Blood, UA: NEGATIVE
Glucose, UA: NEGATIVE
Ketones, UA: NEGATIVE
Leukocytes, UA: NEGATIVE
Nitrite, UA: NEGATIVE
Odor: NEGATIVE
Protein, UA: NEGATIVE
Spec Grav, UA: 1.01 (ref 1.010–1.025)
Urobilinogen, UA: 0.2 E.U./dL
pH, UA: 6.5 (ref 5.0–8.0)

## 2019-11-01 NOTE — Patient Instructions (Signed)
Take Miralax daily for constipation. Labs pending with further instructions to follow.

## 2019-11-01 NOTE — Progress Notes (Signed)
   Subjective:    Patient ID: Allison Shields, female    DOB: 1955-11-16, 64 y.o.   MRN: [id number]  HPI 64 year old Female in today to follow up on elevated serum creatinine and constipation. Still has obstipation but not as much constipation. Have suggested Miralax daily 2 tsps in juice or water. Hospitalized with gallstone pancreatitis and had laparoscopic cholecystectomy April 5.  Has just returned to work. Creatinine was 2.43 on April 16. Has hydrated well today. Creatinine was 1.28 on April 3 when presenting to ED with vomiting from gallstone pancreatitis.  Creatinine in November 2020 was 1.02. Creatinine has ranged from 1.09 in August 2020 to 0.93 in 2015. Was 1.02 in April 2018.  Review of Systems see above- no nausea or vomiting     Objective:   Physical Exam BP 102/80 pulse 68 T 98 degrees Weight 196 pounds BMI 34.72  Urine dipstick:     Assessment & Plan:  Constipation/obstipation- to try Miralax daily  Elevated serum creatinine- repeat today as well hydrated  HTN treated with Maxide 75.  NSAID use limit use of Mobic.  Hyperlipidemis- treated with statin.  Impaired glucose tolerance treated with Metformin  GERD treated with generic Protonix  ? Chronic kidney disease with acute insult with volume depletion due to gallstone pancreatitis with vomiting early April. Urine SG was greater than 1.046 on admission April 3rd  Plan: review labs and advise. Take Miralax daily for constipation

## 2019-11-01 NOTE — Addendum Note (Signed)
Addended by: Mady Haagensen on: 11/01/2019 12:14 PM   Modules accepted: Orders

## 2019-11-02 LAB — BASIC METABOLIC PANEL
BUN/Creatinine Ratio: 19 (calc) (ref 6–22)
BUN: 23 mg/dL (ref 7–25)
CO2: 22 mmol/L (ref 20–32)
Calcium: 9.2 mg/dL (ref 8.6–10.4)
Chloride: 97 mmol/L — ABNORMAL LOW (ref 98–110)
Creat: 1.24 mg/dL — ABNORMAL HIGH (ref 0.50–0.99)
Glucose, Bld: 86 mg/dL (ref 65–99)
Potassium: 3.9 mmol/L (ref 3.5–5.3)
Sodium: 133 mmol/L — ABNORMAL LOW (ref 135–146)

## 2019-11-19 ENCOUNTER — Other Ambulatory Visit: Payer: Self-pay | Admitting: Internal Medicine

## 2020-01-17 MED FILL — SIMVASTATIN 20 MG TABLET: 20 | 90 days supply | Qty: 90 | Fill #2

## 2020-01-31 ENCOUNTER — Encounter: Payer: 59 | Admitting: Internal Medicine

## 2020-02-20 MED FILL — metFORMIN HCL ER 500 MG TB2: 500 | 90 days supply | Qty: 90 | Fill #1

## 2020-03-16 MED FILL — TRIAMTERENE-HCTZ 75-50 MG T: 75-50 | 90 days supply | Qty: 90 | Fill #1

## 2020-04-03 ENCOUNTER — Ambulatory Visit (INDEPENDENT_AMBULATORY_CARE_PROVIDER_SITE_OTHER): Payer: 59 | Admitting: Internal Medicine

## 2020-04-03 ENCOUNTER — Encounter: Payer: Self-pay | Admitting: Internal Medicine

## 2020-04-03 ENCOUNTER — Other Ambulatory Visit: Payer: Self-pay

## 2020-04-03 ENCOUNTER — Other Ambulatory Visit (HOSPITAL_COMMUNITY)
Admission: RE | Admit: 2020-04-03 | Discharge: 2020-04-03 | Disposition: A | Discharge status: Home / Self Care | Payer: 59 | Source: Ambulatory Visit | Attending: Internal Medicine | Admitting: Internal Medicine

## 2020-04-03 VITALS — BP 120/80 | HR 75 | Ht 63.0 in | Wt 195.0 lb

## 2020-04-03 DIAGNOSIS — R7989 Other specified abnormal findings of blood chemistry: Secondary | ICD-10-CM | POA: Diagnosis not present

## 2020-04-03 DIAGNOSIS — R7302 Impaired glucose tolerance (oral): Secondary | ICD-10-CM | POA: Diagnosis not present

## 2020-04-03 DIAGNOSIS — Z124 Encounter for screening for malignant neoplasm of cervix: Secondary | ICD-10-CM | POA: Insufficient documentation

## 2020-04-03 DIAGNOSIS — E1169 Type 2 diabetes mellitus with other specified complication: Secondary | ICD-10-CM | POA: Diagnosis not present

## 2020-04-03 DIAGNOSIS — Z9049 Acquired absence of other specified parts of digestive tract: Secondary | ICD-10-CM | POA: Diagnosis not present

## 2020-04-03 DIAGNOSIS — E8881 Metabolic syndrome: Secondary | ICD-10-CM | POA: Diagnosis not present

## 2020-04-03 DIAGNOSIS — Z0000 Encounter for general adult medical examination without abnormal findings: Secondary | ICD-10-CM | POA: Diagnosis not present

## 2020-04-03 DIAGNOSIS — Z6834 Body mass index (BMI) 34.0-34.9, adult: Secondary | ICD-10-CM

## 2020-04-03 DIAGNOSIS — R609 Edema, unspecified: Secondary | ICD-10-CM

## 2020-04-03 DIAGNOSIS — E785 Hyperlipidemia, unspecified: Secondary | ICD-10-CM | POA: Diagnosis not present

## 2020-04-03 DIAGNOSIS — K219 Gastro-esophageal reflux disease without esophagitis: Secondary | ICD-10-CM | POA: Diagnosis not present

## 2020-04-03 DIAGNOSIS — E7800 Pure hypercholesterolemia, unspecified: Secondary | ICD-10-CM

## 2020-04-03 DIAGNOSIS — H409 Unspecified glaucoma: Secondary | ICD-10-CM

## 2020-04-03 LAB — POCT URINALYSIS DIPSTICK
Appearance: NEGATIVE
Bilirubin, UA: NEGATIVE
Blood, UA: NEGATIVE
Glucose, UA: NEGATIVE
Ketones, UA: NEGATIVE
Leukocytes, UA: NEGATIVE
Nitrite, UA: NEGATIVE
Odor: NEGATIVE
Protein, UA: NEGATIVE
Spec Grav, UA: 1.015 (ref 1.010–1.025)
Urobilinogen, UA: 0.2 E.U./dL
pH, UA: 6.5 (ref 5.0–8.0)

## 2020-04-03 MED ORDER — PHENTERMINE HCL 37.5 MG PO CAPS: 37.5000 mg | ORAL_CAPSULE | ORAL | 0 refills | Status: DC

## 2020-04-03 NOTE — Patient Instructions (Signed)
Labs will be reviewed with further instructions to follow.  She has been given phentermine to take sparingly as appetite suppressant (# 90 capsules).  Immunizations are up-to-date.  Colonoscopy is up-to-date.  Order placed for mammogram.

## 2020-04-03 NOTE — Progress Notes (Signed)
Subjective:    Patient ID: Allison Shields, female    DOB: April 06, 1956, 64 y.o.   MRN: [id number]  HPI 64 year old Female for health maintenance exam and evaluation of medical issues.  Feels well with no new complaints.  Had flu vaccine through [place].  Had diabetic eye exam Nov 01, 2019. Has glaucoma and cataracts.  In April 2021, had gallstone pancreatitis resulting in laparoscopic cholecystectomy.  History of glaucoma treated at [facility].  Had right eye surgery for glaucoma in 2008.  History of pure hypercholesterolemia dating back to at least 2012 treated with statin therapy.  History of impaired glucose tolerance.  History of dependent edema treated with Maxide 75 daily.  No history of hypertension.  Had colonoscopy in 2018.  10-year follow-up recommended.  Order placed today for screening mammogram.  Generally requested 90 days of phentermine to help her with diet and weight loss.  I have previously recommended Dr. Migdalia Dk clinic.  In early May 2021 her creatinine was normal at 0.78.  During her hospitalization mid April creatinine was elevated at 2.43 but by May 21, had improved to 1.24. Labs are pending today. May need to consider different diuretic if remains elevated.Urine dipstick is normal.  Social history: She is employed in Water engineer at [employer].  Does not smoke or consume alcohol.  She has a high school education.  She has 2 sons and a 55-year-old granddaughter.  She is separated from her husband.  Her mother is [name] who is also a patient here.  Patient had colonoscopy in 2018.  2 polyps were removed 1 of which was hyperplastic and the other one was benign mucosa.  Family history: Father died with a stroke with history of diabetes.  Mother with history of hypertension, glaucoma, hyperlipidemia, rheumatoid arthritis and newly diagnosed with essential thrombocytosis.  1 brother alive and well.  1 sister died of a GI  malignancy.  1 sister with remote history of sarcoidosis that resolved.  Another sister with history of morbid obesity.  Total of 7 sisters.  Review of Systems no new complaints. Hx Trichomonas vaginalis     Objective:   Physical Exam  Blood pressure 120/80 pulse 75 pulse oximetry 96% weight 195 pounds BMI 34.54.    Skin warm and dry.  No cervical adenopathy.  No thyromegaly.  No carotid bruits.  Chest clear to auscultation.  Breast without masses.  Cardiac exam regular rate and rhythm normal S1 and S2.  No murmurs appreciated.  Abdomen soft nondistended without hepatosplenomegaly masses or tenderness.  Pelvic exam: N FDG.  Pap smear taken.  No vaginal discharge noted.  No masses on bimanual exam.  Rectovaginal confirms.  No lower extremity pitting edema.  Neuro intact without focal deficits.  Affect thought and judgment are normal.      Assessment & Plan:  History of elevated serum creatinine-?  Chronic kidney disease developed after episode of gallstone pancreatitis.  If creatinine remains elevated, we will need to discontinue Maxide 75 and perhaps use Lasix instead.  She may need ultrasound of her kidneys.  No history of hypertension  History of dependent edema treated with Maxide 75  History of gallstone pancreatitis status post laparoscopic cholecystectomy April 2021  History of trichomonas vaginalis-Pap smear taken today.  No vaginal discharge.  BMI 34.54.  She does not want to go to Dr. Migdalia Dk clinic.  We have given her 90 days of phentermine to take sparingly for appetite suppressant.  Pure hypercholesterolemia treated  with Zocor 20 mg daily  Impaired glucose tolerance treated with Metformin.  Hemoglobin A1c 6.2% in August 2020.  Hemoglobin A1c obtained today and results are pending.  Plan: Review fasting lab results with further recommendations to follow.  Order placed for mammogram which is due in December.  She is dated flu vaccine through employment and 2 COVID-19  vaccines through employment.  Tetanus immunization is up-to-date.

## 2020-04-04 LAB — LIPID PANEL
Cholesterol: 221 mg/dL — ABNORMAL HIGH (ref <200)
HDL: 72 mg/dL (ref >=50)
LDL Cholesterol (Calc): 132 mg/dL (calc) — ABNORMAL HIGH
Non-HDL Cholesterol (Calc): 149 mg/dL (calc) — ABNORMAL HIGH (ref <130)
Total CHOL/HDL Ratio: 3.1 (calc) (ref <5.0)
Triglycerides: 80 mg/dL (ref <150)

## 2020-04-04 LAB — COMPLETE METABOLIC PANEL WITH GFR
AG Ratio: 0.8 (calc) — ABNORMAL LOW (ref 1.0–2.5)
ALT: 10 U/L (ref 6–29)
AST: 15 U/L (ref 10–35)
Albumin: 4 g/dL (ref 3.6–5.1)
Alkaline phosphatase (APISO): 74 U/L (ref 37–153)
BUN/Creatinine Ratio: 20 (calc) (ref 6–22)
BUN: 21 mg/dL (ref 7–25)
CO2: 27 mmol/L (ref 20–32)
Calcium: 9.6 mg/dL (ref 8.6–10.4)
Chloride: 97 mmol/L — ABNORMAL LOW (ref 98–110)
Creat: 1.04 mg/dL — ABNORMAL HIGH (ref 0.50–0.99)
GFR, Est African American: 67 mL/min/{1.73_m2} (ref >=60)
GFR, Est Non African American: 58 mL/min/{1.73_m2} — ABNORMAL LOW (ref >=60)
Globulin: 4.8 g/dL (calc) — ABNORMAL HIGH (ref 1.9–3.7)
Glucose, Bld: 82 mg/dL (ref 65–99)
Potassium: 4.3 mmol/L (ref 3.5–5.3)
Sodium: 134 mmol/L — ABNORMAL LOW (ref 135–146)
Total Bilirubin: 0.7 mg/dL (ref 0.2–1.2)
Total Protein: 8.8 g/dL — ABNORMAL HIGH (ref 6.1–8.1)

## 2020-04-04 LAB — CBC WITH DIFFERENTIAL/PLATELET
Absolute Monocytes: 496 cells/uL (ref 200–950)
Basophils Absolute: 27 cells/uL (ref 0–200)
Basophils Relative: 0.4 %
Eosinophils Absolute: 122 cells/uL (ref 15–500)
Eosinophils Relative: 1.8 %
HCT: 39.5 % (ref 35.0–45.0)
Hemoglobin: 12.9 g/dL (ref 11.7–15.5)
Lymphs Abs: 2455 cells/uL (ref 850–3900)
MCH: 30.6 pg (ref 27.0–33.0)
MCHC: 32.7 g/dL (ref 32.0–36.0)
MCV: 93.6 fL (ref 80.0–100.0)
MPV: 10.5 fL (ref 7.5–12.5)
Monocytes Relative: 7.3 %
Neutro Abs: 3699 cells/uL (ref 1500–7800)
Neutrophils Relative %: 54.4 %
Platelets: 282 10*3/uL (ref 140–400)
RBC: 4.22 10*6/uL (ref 3.80–5.10)
RDW: 12 % (ref 11.0–15.0)
Total Lymphocyte: 36.1 %
WBC: 6.8 10*3/uL (ref 3.8–10.8)

## 2020-04-04 LAB — HEMOGLOBIN A1C
Hgb A1c MFr Bld: 6.1 % of total Hgb — ABNORMAL HIGH (ref <5.7)
Mean Plasma Glucose: 128 (calc)
eAG (mmol/L): 7.1 (calc)

## 2020-04-04 LAB — TSH: TSH: 1.08 mIU/L (ref 0.40–4.50)

## 2020-04-04 LAB — MICROALBUMIN / CREATININE URINE RATIO
Creatinine, Urine: 125 mg/dL (ref 20–275)
Microalb, Ur: <0.2 mg/dL

## 2020-04-07 LAB — CYTOLOGY - PAP
Comment: NEGATIVE
Diagnosis: NEGATIVE
High risk HPV: NEGATIVE

## 2020-05-06 ENCOUNTER — Telehealth: Payer: Self-pay | Admitting: Internal Medicine

## 2020-05-06 NOTE — Telephone Encounter (Signed)
Patient called back to say it was just a muscle spasm, that she is okay.

## 2020-05-06 NOTE — Telephone Encounter (Signed)
Needs to see orthopedist

## 2020-05-06 NOTE — Telephone Encounter (Signed)
Allison Shields [phone]  Jalei called to say she is having left shoulder pain since last week, it is not getting any better.

## 2020-05-13 ENCOUNTER — Other Ambulatory Visit: Payer: Self-pay | Admitting: Internal Medicine

## 2020-05-13 MED FILL — SIMVASTATIN 20 MG TABLET: 20 | 90 days supply | Qty: 90 | Fill #0

## 2020-05-14 ENCOUNTER — Other Ambulatory Visit: Payer: Self-pay | Admitting: Internal Medicine

## 2020-05-14 MED FILL — metFORMIN HCL ER 500 MG TB2: 500 | 90 days supply | Qty: 90 | Fill #0

## 2020-05-22 DIAGNOSIS — H2513 Age-related nuclear cataract, bilateral: Secondary | ICD-10-CM | POA: Diagnosis not present

## 2020-05-22 DIAGNOSIS — H4031X3 Glaucoma secondary to eye trauma, right eye, severe stage: Secondary | ICD-10-CM | POA: Diagnosis not present

## 2020-05-22 DIAGNOSIS — H25011 Cortical age-related cataract, right eye: Secondary | ICD-10-CM | POA: Diagnosis not present

## 2020-05-29 ENCOUNTER — Other Ambulatory Visit: Payer: Self-pay

## 2020-05-29 ENCOUNTER — Ambulatory Visit
Admission: RE | Admit: 2020-05-29 | Discharge: 2020-05-29 | Disposition: A | Discharge status: Home / Self Care | Payer: 59 | Source: Ambulatory Visit | Attending: Internal Medicine | Admitting: Internal Medicine

## 2020-05-29 DIAGNOSIS — Z1231 Encounter for screening mammogram for malignant neoplasm of breast: Secondary | ICD-10-CM | POA: Diagnosis not present

## 2020-06-04 ENCOUNTER — Other Ambulatory Visit: Payer: Self-pay | Admitting: Internal Medicine

## 2020-06-04 DIAGNOSIS — N6489 Other specified disorders of breast: Secondary | ICD-10-CM

## 2020-06-11 ENCOUNTER — Inpatient Hospital Stay: Payer: 59 | Attending: Internal Medicine

## 2020-06-11 ENCOUNTER — Other Ambulatory Visit: Payer: Self-pay

## 2020-06-11 DIAGNOSIS — Z23 Encounter for immunization: Secondary | ICD-10-CM | POA: Insufficient documentation

## 2020-06-11 NOTE — Progress Notes (Signed)
   Covid-19 Vaccination Clinic  Name:  Allison Shields    MRN: [id number] DOB: Nov 08, 1955  06/11/2020  [name] was observed post Covid-19 immunization for 15 minutes without incident. She was provided with Vaccine Information Sheet and instruction to access the V-Safe system.   [name] was instructed to call 911 with any severe reactions post vaccine: Marland Kitchen Difficulty breathing  . Swelling of face and throat  . A fast heartbeat  . A bad rash all over body  . Dizziness and weakness   Immunizations Administered    Name Date Dose VIS Date Route   Pfizer COVID-19 Vaccine 06/11/2020  9:37 AM 0.3 mL 04/01/2020 Intramuscular   Manufacturer: [employer], [employer]   Lot: [id number]   NDC: [address]-2

## 2020-06-17 ENCOUNTER — Other Ambulatory Visit: Payer: Self-pay

## 2020-06-17 ENCOUNTER — Ambulatory Visit: Payer: 59

## 2020-06-17 ENCOUNTER — Ambulatory Visit
Admission: RE | Admit: 2020-06-17 | Discharge: 2020-06-17 | Disposition: A | Discharge status: Home / Self Care | Payer: 59 | Source: Ambulatory Visit | Attending: Internal Medicine | Admitting: Internal Medicine

## 2020-06-17 DIAGNOSIS — N6489 Other specified disorders of breast: Secondary | ICD-10-CM

## 2020-06-17 DIAGNOSIS — R922 Inconclusive mammogram: Secondary | ICD-10-CM | POA: Diagnosis not present

## 2020-06-24 MED FILL — TRIAMTERENE-HCTZ 75-50 MG T: 75-50 | 90 days supply | Qty: 90 | Fill #0

## 2020-08-21 ENCOUNTER — Other Ambulatory Visit: Payer: Self-pay | Admitting: Internal Medicine

## 2020-08-21 MED FILL — SIMVASTATIN 20 MG TABLET: 20 | 90 days supply | Qty: 90 | Fill #0

## 2020-08-21 MED FILL — metFORMIN HCL ER 500 MG TB2: 500 | 90 days supply | Qty: 90 | Fill #1

## 2020-09-22 ENCOUNTER — Other Ambulatory Visit (HOSPITAL_COMMUNITY): Payer: Self-pay

## 2020-09-22 MED FILL — Triamterene & Hydrochlorothiazide Tab 75-50 MG: ORAL | 90 days supply | Qty: 90 | Fill #0 | Status: AC

## 2020-11-20 ENCOUNTER — Other Ambulatory Visit (HOSPITAL_COMMUNITY): Payer: Self-pay

## 2020-11-20 ENCOUNTER — Other Ambulatory Visit: Payer: Self-pay | Admitting: Internal Medicine

## 2020-11-20 MED ORDER — METFORMIN HCL ER 500 MG PO TB24
ORAL_TABLET | Freq: Every day | ORAL | 1 refills | Status: DC
  Filled 2020-11-20: qty 90, 90d supply, fill #0
  Filled 2021-03-01: qty 90, 90d supply, fill #1

## 2020-11-20 MED ORDER — TRIAMTERENE-HCTZ 75-50 MG PO TABS
1.0000 | ORAL_TABLET | Freq: Every day | ORAL | 1 refills | Status: DC
  Filled 2020-11-20 – 2020-12-24 (×2): qty 90, 90d supply, fill #0
  Filled 2021-03-25: qty 90, 90d supply, fill #1

## 2020-11-20 MED FILL — Simvastatin Tab 20 MG: ORAL | 90 days supply | Qty: 90 | Fill #0 | Status: AC

## 2020-12-09 DIAGNOSIS — H4031X3 Glaucoma secondary to eye trauma, right eye, severe stage: Secondary | ICD-10-CM | POA: Diagnosis not present

## 2020-12-24 ENCOUNTER — Other Ambulatory Visit (HOSPITAL_COMMUNITY): Payer: Self-pay

## 2021-03-01 ENCOUNTER — Other Ambulatory Visit: Payer: Self-pay | Admitting: Internal Medicine

## 2021-03-01 ENCOUNTER — Other Ambulatory Visit (HOSPITAL_COMMUNITY): Payer: Self-pay

## 2021-03-01 MED FILL — Simvastatin Tab 20 MG: ORAL | 90 days supply | Qty: 90 | Fill #0 | Status: AC

## 2021-03-02 ENCOUNTER — Other Ambulatory Visit (HOSPITAL_COMMUNITY): Payer: Self-pay

## 2021-03-25 ENCOUNTER — Other Ambulatory Visit (HOSPITAL_COMMUNITY): Payer: Self-pay

## 2021-04-05 ENCOUNTER — Encounter: Payer: Self-pay | Admitting: Internal Medicine

## 2021-04-05 ENCOUNTER — Ambulatory Visit (INDEPENDENT_AMBULATORY_CARE_PROVIDER_SITE_OTHER): Payer: 59 | Admitting: Internal Medicine

## 2021-04-05 ENCOUNTER — Other Ambulatory Visit (HOSPITAL_COMMUNITY): Payer: Self-pay

## 2021-04-05 ENCOUNTER — Other Ambulatory Visit: Payer: Self-pay

## 2021-04-05 VITALS — BP 128/88 | HR 67 | Temp 98.2°F | Ht 62.5 in | Wt 187.0 lb

## 2021-04-05 DIAGNOSIS — H4031X0 Glaucoma secondary to eye trauma, right eye, stage unspecified: Secondary | ICD-10-CM | POA: Diagnosis not present

## 2021-04-05 DIAGNOSIS — Z6833 Body mass index (BMI) 33.0-33.9, adult: Secondary | ICD-10-CM | POA: Diagnosis not present

## 2021-04-05 DIAGNOSIS — R7302 Impaired glucose tolerance (oral): Secondary | ICD-10-CM | POA: Diagnosis not present

## 2021-04-05 DIAGNOSIS — Z1329 Encounter for screening for other suspected endocrine disorder: Secondary | ICD-10-CM

## 2021-04-05 DIAGNOSIS — R609 Edema, unspecified: Secondary | ICD-10-CM

## 2021-04-05 DIAGNOSIS — Z0000 Encounter for general adult medical examination without abnormal findings: Secondary | ICD-10-CM | POA: Diagnosis not present

## 2021-04-05 DIAGNOSIS — E7800 Pure hypercholesterolemia, unspecified: Secondary | ICD-10-CM | POA: Diagnosis not present

## 2021-04-05 DIAGNOSIS — Z9049 Acquired absence of other specified parts of digestive tract: Secondary | ICD-10-CM | POA: Diagnosis not present

## 2021-04-05 LAB — POCT URINALYSIS DIPSTICK
Bilirubin, UA: NEGATIVE
Blood, UA: NEGATIVE
Glucose, UA: NEGATIVE
Ketones, UA: NEGATIVE
Leukocytes, UA: NEGATIVE
Nitrite, UA: NEGATIVE
Protein, UA: NEGATIVE
Spec Grav, UA: 1.015 (ref 1.010–1.025)
Urobilinogen, UA: 0.2 E.U./dL
pH, UA: 5 (ref 5.0–8.0)

## 2021-04-05 MED ORDER — PHENTERMINE HCL 37.5 MG PO CAPS: 37.5000 mg | ORAL_CAPSULE | Freq: Every day | ORAL | 0 refills | Status: DC

## 2021-04-05 MED ORDER — PHENTERMINE HCL 37.5 MG PO CAPS
37.5000 mg | ORAL_CAPSULE | Freq: Every day | ORAL | 0 refills | Status: DC
  Filled 2021-04-05: qty 90, 90d supply, fill #0

## 2021-04-05 NOTE — Patient Instructions (Addendum)
It was a pleasure to see you today.  Have prescribed phentermine 37.5 mg daily for weight loss (#90). Labs drawn and pending.  Return in 1 year or as needed.  Glad you had flu vaccine.  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

## 2021-04-05 NOTE — Progress Notes (Signed)
Subjective:    Patient ID: Allison Shields, female    DOB: Oct 10, 1955, 65 y.o.   MRN: [id number]  HPI 65 year old Female for health maintenance exam and evaluation of medical issues. Flu vaccine done through employment.No changes in health. Feels well with no new complaints. Does not want additional Covid vaccines but will get Zostavax vaccine. Continues to work full time at [employer].  History of glaucoma secondary to trauma right eye. Bilateral cataracts.  Seen by Dr. Satira Sark December 2021.  Had right eye surgery for glaucoma in 2008.  In April 2021, had gallstone pancreatitis resulting in laparoscopic cholecystectomy.  Has flu vaccine through employee health:  History of pure hypercholesterolemia dating back to at least 2012 treated with statin therapy.  History of impaired glucose tolerance.  History of dependent edema treated with Maxide 75 daily.  No history of hypertension.  Had colonoscopy in 2018.  10-year follow-up recommended.  She requests phentermine to help with diet and weight loss.  Given 90-day supply.  In early May 2021 her creatinine was normal at 0.78.  During her hospitalization in mid April creatinine was elevated 2.43 but May 21 and improved to 1.24.  Last year creatinine had improved to 1.04.  Results are pending today.  Social history: She is employed in Water engineer at [employer].  Does not smoke or consume alcohol.  She has a high school education.  She has 2 sons and granddaughter.  She is separated from her husband.  Her mother is [name] who is also a patient here.  Family history: Father died with a stroke with history of diabetes.  Mother with history of hypertension, glaucoma, hyperlipidemia, rheumatoid arthritis and essential thrombocytosis.  1 brother alive and well.  1 sister with remote history of sarcoidosis that resolved.  1 sister died of GI malignancy.  Another sister with history of morbid obesity.  Total of 7  sisters.          Review of Systems Had Pap in 2021.  History of trichomonas vaginalis.  No new complaints.     Objective:   Physical Exam Blood pressure 128/88, pulse 67 regular temperature 98.2 degrees pulse oximetry 98% weight 187 pounds BMI 33.66 height 5 feet 2.5 inches  Skin: Warm and dry.  No cervical adenopathy.  No thyromegaly.  TMs are clear.  Neck is supple.  No carotid bruits.  Chest is clear to auscultation without rales or wheezing.  Breasts are without masses.  Cardiac exam: Regular rate and rhythm without murmur.  Abdomen: Soft nondistended without hepatosplenomegaly, masses, or tenderness.  Pap smear was taken in 2021 and was deferred this year.  No vaginal discharge.  Bimanual exam is normal.  Rectovaginal confirms.  No lower extremity pitting edema.  Neurological exam is intact without focal deficits.  Affect, thought and judgement are normal.     Assessment & Plan:   Hx of elevated serum creatinine. This has improved and is nearly normal.  BMI 33.66- given 90 days of Phentermine as requested. Continue diet and exercise regimen  Dependent edema treated with Maxzide  Pure hypercholesterolemiaStable with statin therapy  No history of hypertension  History of trichomonas vaginalis- Pap not taken today.  No vaginal discharge.  Bimanual normal.  Impaired glucose tolerance-Treated with metformin  History of gallstone pancreatitis status post laparoscopic cholecystectomy April 2021  Plan: Return in 1 year or as needed.  Continue current medications.  90 days of phentermine prescribed at patient request.  No change  in other medications.  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  She will get flu vaccine through employment.

## 2021-04-06 ENCOUNTER — Other Ambulatory Visit (HOSPITAL_BASED_OUTPATIENT_CLINIC_OR_DEPARTMENT_OTHER): Payer: Self-pay

## 2021-04-06 LAB — COMPLETE METABOLIC PANEL WITH GFR
AG Ratio: 0.8 (calc) — ABNORMAL LOW (ref 1.0–2.5)
ALT: 10 U/L (ref 6–29)
AST: 15 U/L (ref 10–35)
Albumin: 3.8 g/dL (ref 3.6–5.1)
Alkaline phosphatase (APISO): 66 U/L (ref 37–153)
BUN/Creatinine Ratio: 24 (calc) — ABNORMAL HIGH (ref 6–22)
BUN: 26 mg/dL — ABNORMAL HIGH (ref 7–25)
CO2: 28 mmol/L (ref 20–32)
Calcium: 9.7 mg/dL (ref 8.6–10.4)
Chloride: 99 mmol/L (ref 98–110)
Creat: 1.08 mg/dL — ABNORMAL HIGH (ref 0.50–1.05)
Globulin: 4.6 g/dL (calc) — ABNORMAL HIGH (ref 1.9–3.7)
Glucose, Bld: 88 mg/dL (ref 65–99)
Potassium: 4.4 mmol/L (ref 3.5–5.3)
Sodium: 139 mmol/L (ref 135–146)
Total Bilirubin: 0.6 mg/dL (ref 0.2–1.2)
Total Protein: 8.4 g/dL — ABNORMAL HIGH (ref 6.1–8.1)
eGFR: 58 mL/min/{1.73_m2} — ABNORMAL LOW (ref >=60)

## 2021-04-06 LAB — CBC WITH DIFFERENTIAL/PLATELET
Absolute Monocytes: 441 cells/uL (ref 200–950)
Basophils Absolute: 50 cells/uL (ref 0–200)
Basophils Relative: 0.8 %
Eosinophils Absolute: 88 cells/uL (ref 15–500)
Eosinophils Relative: 1.4 %
HCT: 39.2 % (ref 35.0–45.0)
Hemoglobin: 12.7 g/dL (ref 11.7–15.5)
Lymphs Abs: 2199 cells/uL (ref 850–3900)
MCH: 30 pg (ref 27.0–33.0)
MCHC: 32.4 g/dL (ref 32.0–36.0)
MCV: 92.5 fL (ref 80.0–100.0)
MPV: 11.4 fL (ref 7.5–12.5)
Monocytes Relative: 7 %
Neutro Abs: 3522 cells/uL (ref 1500–7800)
Neutrophils Relative %: 55.9 %
Platelets: 262 10*3/uL (ref 140–400)
RBC: 4.24 10*6/uL (ref 3.80–5.10)
RDW: 11.9 % (ref 11.0–15.0)
Total Lymphocyte: 34.9 %
WBC: 6.3 10*3/uL (ref 3.8–10.8)

## 2021-04-06 LAB — LIPID PANEL
Cholesterol: 215 mg/dL — ABNORMAL HIGH (ref <200)
HDL: 69 mg/dL (ref >=50)
LDL Cholesterol (Calc): 126 mg/dL (calc) — ABNORMAL HIGH
Non-HDL Cholesterol (Calc): 146 mg/dL (calc) — ABNORMAL HIGH (ref <130)
Total CHOL/HDL Ratio: 3.1 (calc) (ref <5.0)
Triglycerides: 102 mg/dL (ref <150)

## 2021-04-06 LAB — TSH: TSH: 0.96 mIU/L (ref 0.40–4.50)

## 2021-04-06 MED ORDER — ZOSTER VAC RECOMB ADJUVANTED 50 MCG/0.5ML IM SUSR
INTRAMUSCULAR | 0 refills | Status: DC
  Filled 2021-04-06: qty 0.5, 1d supply, fill #0

## 2021-04-09 ENCOUNTER — Other Ambulatory Visit (HOSPITAL_BASED_OUTPATIENT_CLINIC_OR_DEPARTMENT_OTHER): Payer: Self-pay

## 2021-04-16 ENCOUNTER — Other Ambulatory Visit (HOSPITAL_COMMUNITY): Payer: Self-pay

## 2021-04-16 ENCOUNTER — Telehealth: Payer: Self-pay | Admitting: Internal Medicine

## 2021-04-16 DIAGNOSIS — E7800 Pure hypercholesterolemia, unspecified: Secondary | ICD-10-CM

## 2021-04-16 MED ORDER — SIMVASTATIN 40 MG PO TABS
40.0000 mg | ORAL_TABLET | Freq: Every day | ORAL | 3 refills | Status: DC
  Filled 2021-04-16: qty 90, 90d supply, fill #0
  Filled 2021-07-20: qty 90, 90d supply, fill #1
  Filled 2021-10-18: qty 90, 90d supply, fill #2
  Filled 2022-01-18: qty 90, 90d supply, fill #3

## 2021-04-16 NOTE — Telephone Encounter (Signed)
She called and said she was taking Zocor regularly 20 mg daily. Increase Zocor to 40 mg daily. She gets plenty of exercise with her work.Results reviewed with her today by phone.  MJB, MD

## 2021-04-19 ENCOUNTER — Other Ambulatory Visit (HOSPITAL_COMMUNITY): Payer: Self-pay

## 2021-05-12 ENCOUNTER — Telehealth: Payer: Self-pay

## 2021-05-12 ENCOUNTER — Other Ambulatory Visit (HOSPITAL_COMMUNITY): Payer: Self-pay

## 2021-05-12 DIAGNOSIS — M25561 Pain in right knee: Secondary | ICD-10-CM | POA: Diagnosis not present

## 2021-05-12 MED ORDER — MELOXICAM 7.5 MG PO TABS
ORAL_TABLET | ORAL | 0 refills | Status: DC
  Filled 2021-05-12: qty 14, 7d supply, fill #0

## 2021-05-12 NOTE — Telephone Encounter (Signed)
Called patient to schedule OV for next week and she said she could not wait till next week, said she would try urgent care so I suggested one of the [facility] or [facility]

## 2021-05-12 NOTE — Telephone Encounter (Signed)
Patient states that her knee is still hurting. She is asking for an appointment to discuss knee pain and how to fix it. Please advise

## 2021-05-19 ENCOUNTER — Other Ambulatory Visit (HOSPITAL_COMMUNITY): Payer: Self-pay

## 2021-05-19 DIAGNOSIS — M25561 Pain in right knee: Secondary | ICD-10-CM | POA: Diagnosis not present

## 2021-05-19 MED ORDER — MELOXICAM 15 MG PO TABS
ORAL_TABLET | ORAL | 0 refills | Status: DC
  Filled 2021-05-19: qty 30, 30d supply, fill #0

## 2021-06-01 ENCOUNTER — Other Ambulatory Visit: Payer: Self-pay | Admitting: Internal Medicine

## 2021-06-01 DIAGNOSIS — Z1231 Encounter for screening mammogram for malignant neoplasm of breast: Secondary | ICD-10-CM

## 2021-06-03 ENCOUNTER — Other Ambulatory Visit (HOSPITAL_COMMUNITY): Payer: Self-pay

## 2021-06-03 ENCOUNTER — Other Ambulatory Visit: Payer: Self-pay | Admitting: Internal Medicine

## 2021-06-03 MED ORDER — METFORMIN HCL ER 500 MG PO TB24
ORAL_TABLET | Freq: Every day | ORAL | 1 refills | Status: DC
  Filled 2021-06-03: qty 90, 90d supply, fill #0
  Filled 2021-09-02: qty 90, 90d supply, fill #1

## 2021-06-11 ENCOUNTER — Other Ambulatory Visit (HOSPITAL_BASED_OUTPATIENT_CLINIC_OR_DEPARTMENT_OTHER): Payer: Self-pay

## 2021-06-11 MED ORDER — ZOSTER VAC RECOMB ADJUVANTED 50 MCG/0.5ML IM SUSR
INTRAMUSCULAR | 0 refills | Status: DC
  Filled 2021-06-11: qty 0.5, 1d supply, fill #0

## 2021-06-18 ENCOUNTER — Other Ambulatory Visit (HOSPITAL_COMMUNITY): Payer: Self-pay

## 2021-06-19 ENCOUNTER — Other Ambulatory Visit (HOSPITAL_COMMUNITY): Payer: Self-pay

## 2021-06-21 ENCOUNTER — Other Ambulatory Visit (HOSPITAL_COMMUNITY): Payer: Self-pay

## 2021-06-21 ENCOUNTER — Telehealth: Payer: Self-pay

## 2021-06-21 MED ORDER — TRIAMTERENE-HCTZ 75-50 MG PO TABS
ORAL_TABLET | ORAL | 1 refills | Status: DC
  Filled 2021-06-21: qty 90, 90d supply, fill #0
  Filled 2021-09-22: qty 90, 90d supply, fill #1

## 2021-06-21 NOTE — Telephone Encounter (Signed)
Patient states that the pharmacy told her you denied her triamterene-HCTZ. She was told to call and talk to [place]. She states that she doesn't have any swelling and hasn't since the summer. Is this a medication that she needs to still be on or was it denied on purpose.

## 2021-06-21 NOTE — Telephone Encounter (Signed)
Patient is aware as I have spoken to her. I have also let a detailed message on the pharmacy's line and they are to call with any questions.

## 2021-07-02 ENCOUNTER — Ambulatory Visit
Admission: RE | Admit: 2021-07-02 | Discharge: 2021-07-02 | Disposition: A | Discharge status: Home / Self Care | Payer: 59 | Source: Ambulatory Visit | Attending: Internal Medicine | Admitting: Internal Medicine

## 2021-07-02 ENCOUNTER — Other Ambulatory Visit: Payer: Self-pay

## 2021-07-02 DIAGNOSIS — Z1231 Encounter for screening mammogram for malignant neoplasm of breast: Secondary | ICD-10-CM | POA: Diagnosis not present

## 2021-07-20 ENCOUNTER — Other Ambulatory Visit (HOSPITAL_COMMUNITY): Payer: Self-pay

## 2021-07-28 DIAGNOSIS — E119 Type 2 diabetes mellitus without complications: Secondary | ICD-10-CM | POA: Diagnosis not present

## 2021-07-28 DIAGNOSIS — H25013 Cortical age-related cataract, bilateral: Secondary | ICD-10-CM | POA: Diagnosis not present

## 2021-07-28 DIAGNOSIS — H4031X3 Glaucoma secondary to eye trauma, right eye, severe stage: Secondary | ICD-10-CM | POA: Diagnosis not present

## 2021-07-28 DIAGNOSIS — H2513 Age-related nuclear cataract, bilateral: Secondary | ICD-10-CM | POA: Diagnosis not present

## 2021-07-28 LAB — HM DIABETES EYE EXAM

## 2021-07-29 ENCOUNTER — Encounter: Payer: Self-pay | Admitting: Internal Medicine

## 2021-09-02 ENCOUNTER — Other Ambulatory Visit (HOSPITAL_COMMUNITY): Payer: Self-pay

## 2021-09-22 ENCOUNTER — Other Ambulatory Visit (HOSPITAL_COMMUNITY): Payer: Self-pay

## 2021-09-23 ENCOUNTER — Other Ambulatory Visit (HOSPITAL_COMMUNITY): Payer: Self-pay

## 2021-10-18 ENCOUNTER — Other Ambulatory Visit (HOSPITAL_COMMUNITY): Payer: Self-pay

## 2021-12-08 ENCOUNTER — Other Ambulatory Visit (HOSPITAL_COMMUNITY): Payer: Self-pay

## 2021-12-08 ENCOUNTER — Other Ambulatory Visit: Payer: Self-pay | Admitting: Internal Medicine

## 2021-12-08 MED ORDER — METFORMIN HCL ER 500 MG PO TB24
ORAL_TABLET | Freq: Every day | ORAL | 1 refills | Status: DC
  Filled 2021-12-08: qty 90, 90d supply, fill #0
  Filled 2022-03-09: qty 90, 90d supply, fill #1

## 2021-12-20 ENCOUNTER — Other Ambulatory Visit (HOSPITAL_COMMUNITY): Payer: Self-pay

## 2021-12-20 ENCOUNTER — Other Ambulatory Visit: Payer: Self-pay | Admitting: Internal Medicine

## 2021-12-20 MED FILL — Triamterene & Hydrochlorothiazide Tab 75-50 MG: ORAL | 90 days supply | Qty: 90 | Fill #0 | Status: AC

## 2022-01-06 ENCOUNTER — Other Ambulatory Visit (HOSPITAL_COMMUNITY): Payer: Self-pay

## 2022-01-18 ENCOUNTER — Other Ambulatory Visit (HOSPITAL_COMMUNITY): Payer: Self-pay

## 2022-02-01 DIAGNOSIS — H25013 Cortical age-related cataract, bilateral: Secondary | ICD-10-CM | POA: Diagnosis not present

## 2022-02-01 DIAGNOSIS — H4031X3 Glaucoma secondary to eye trauma, right eye, severe stage: Secondary | ICD-10-CM | POA: Diagnosis not present

## 2022-02-15 IMAGING — MG DIGITAL SCREENING BILAT W/ CAD
5 series · 5 of 5 positions shown · non-contrast
Comparison: Previous exam(s).

CLINICAL DATA: Screening.

EXAM:
DIGITAL SCREENING BILATERAL MAMMOGRAM WITH CAD

[R MLO]
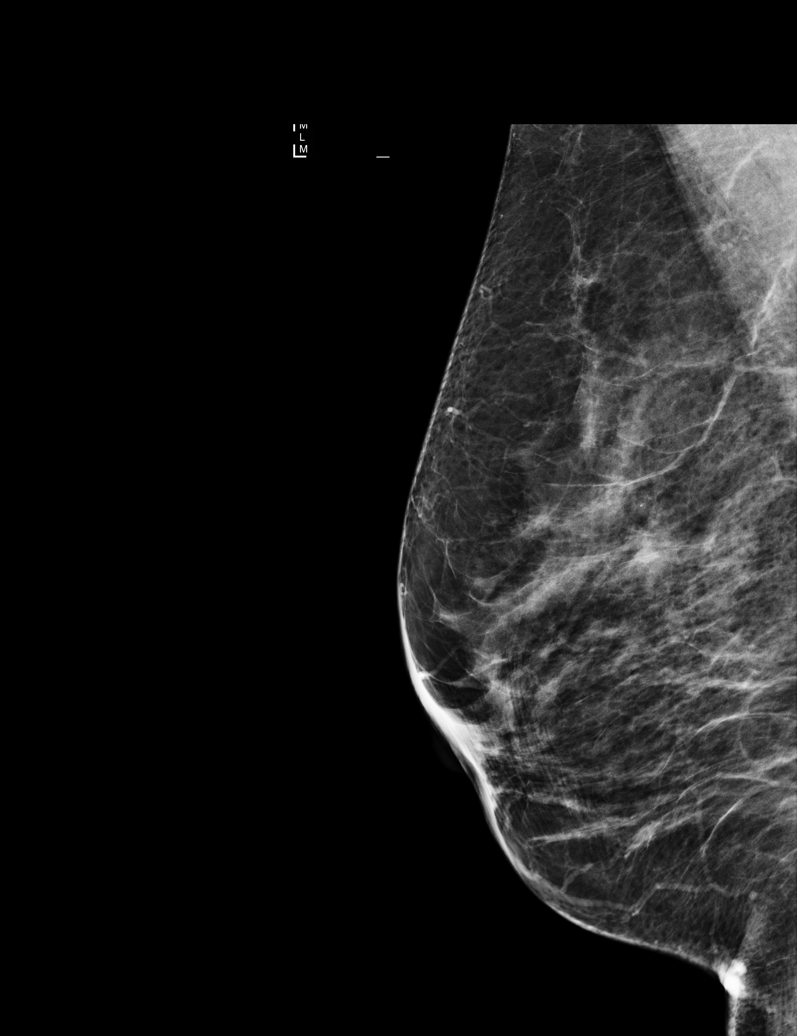

[L MLO (1 of 2)]
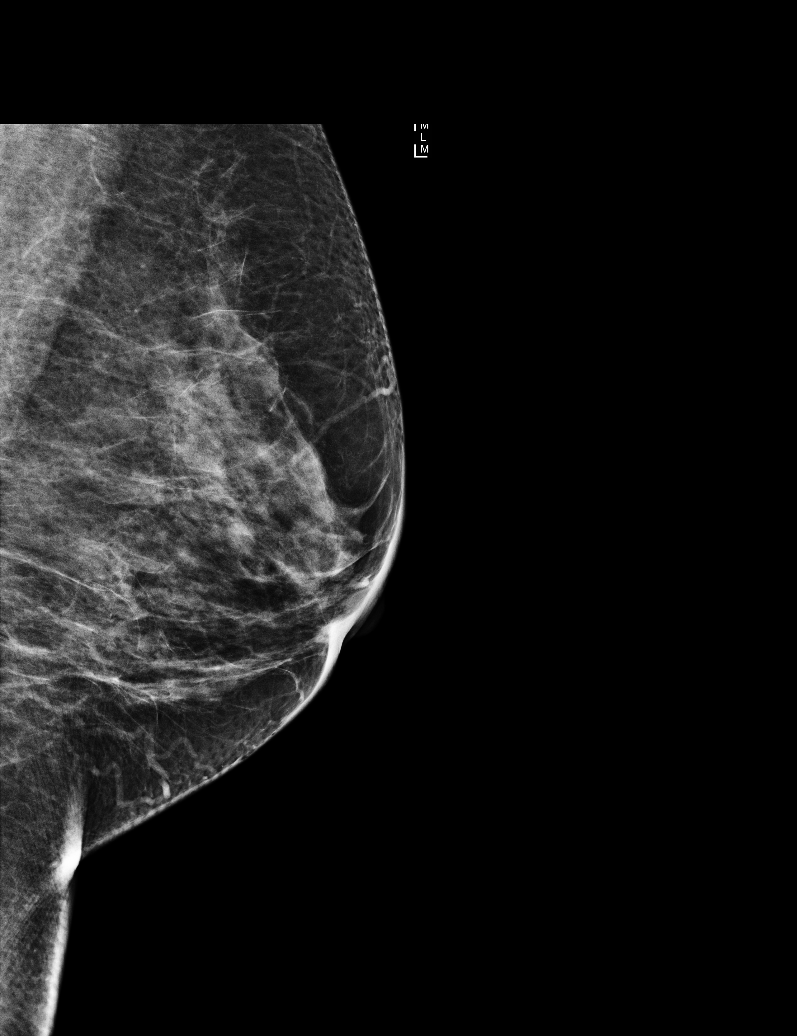

[L CC]
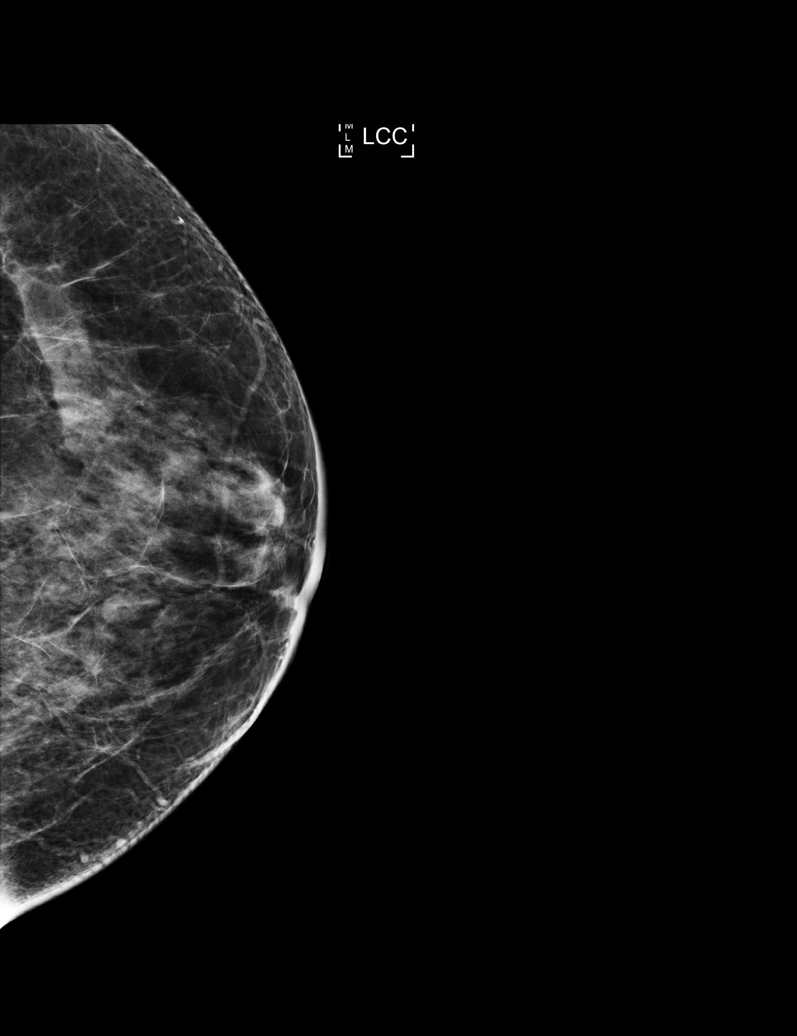

[L MLO (2 of 2)]
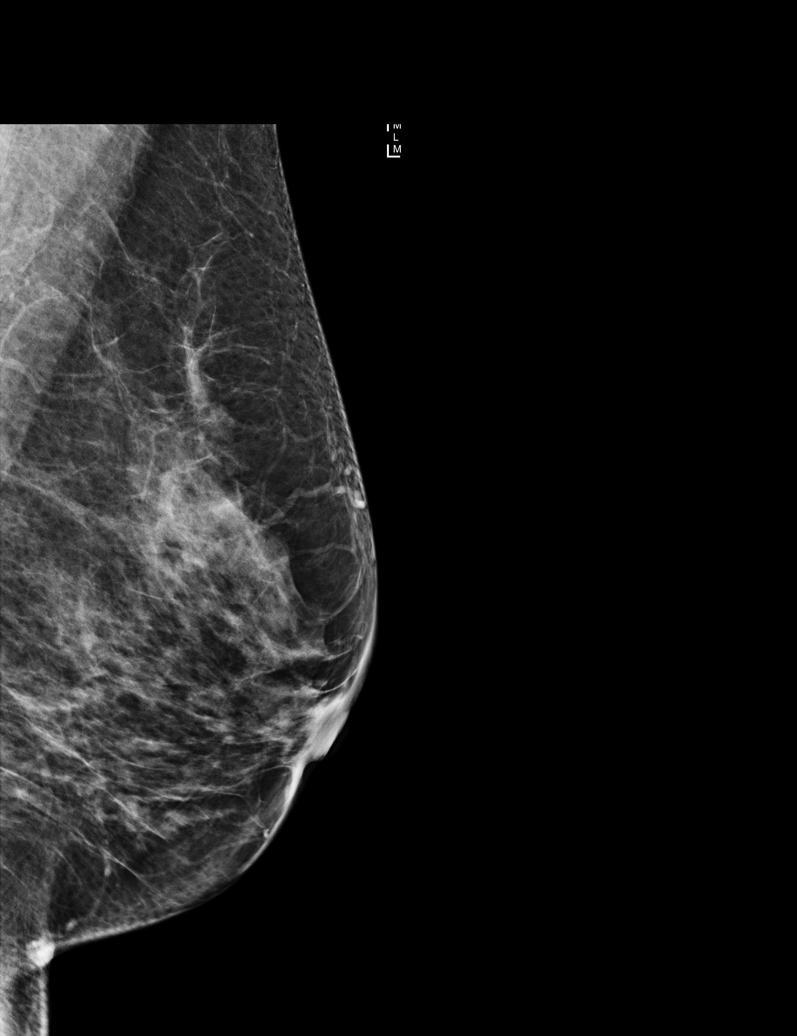

[R CC]
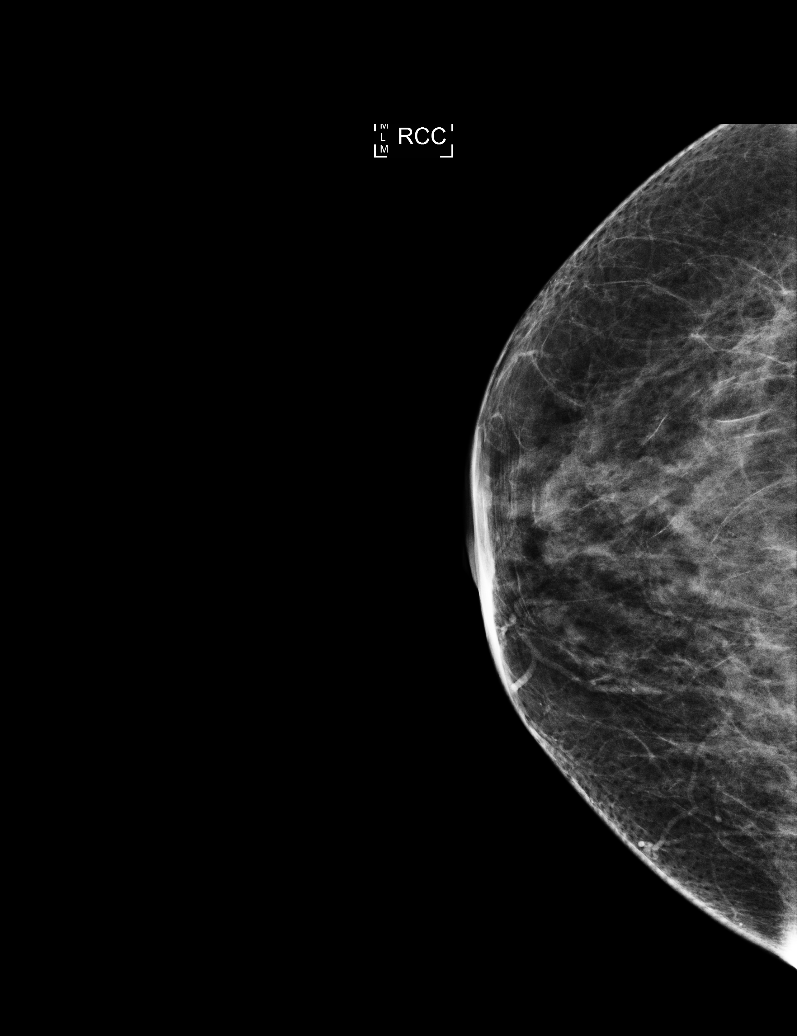

[5 of 5 positions shown; findings below may reference images not displayed]

ACR Breast Density Category c: The breast tissue is heterogeneously
dense, which may obscure small masses.
FINDINGS: In the left breast, a possible mass warrants further evaluation. In
the right breast, no findings suspicious for malignancy.

Images were processed with CAD.
IMPRESSION: Further evaluation is suggested for a possible mass in the left
breast.

RECOMMENDATION:
Diagnostic mammogram and possibly ultrasound of the left breast.
(Code:[id number])

The patient will be contacted regarding the findings, and additional
imaging will be scheduled.

BI-RADS CATEGORY  0: Incomplete. Need additional imaging evaluation
and/or prior mammograms for comparison.

## 2022-03-09 ENCOUNTER — Other Ambulatory Visit (HOSPITAL_COMMUNITY): Payer: Self-pay

## 2022-03-21 ENCOUNTER — Other Ambulatory Visit (HOSPITAL_COMMUNITY): Payer: Self-pay

## 2022-03-21 MED FILL — Triamterene & Hydrochlorothiazide Tab 75-50 MG: ORAL | 90 days supply | Qty: 90 | Fill #1 | Status: AC

## 2022-04-07 ENCOUNTER — Encounter: Payer: 59 | Admitting: Internal Medicine

## 2022-04-08 ENCOUNTER — Ambulatory Visit (INDEPENDENT_AMBULATORY_CARE_PROVIDER_SITE_OTHER): Payer: 59 | Admitting: Internal Medicine

## 2022-04-08 ENCOUNTER — Encounter: Payer: Self-pay | Admitting: Internal Medicine

## 2022-04-08 VITALS — BP 118/74 | HR 76 | Temp 98.1°F | Ht 62.5 in | Wt 195.0 lb

## 2022-04-08 DIAGNOSIS — R7302 Impaired glucose tolerance (oral): Secondary | ICD-10-CM

## 2022-04-08 DIAGNOSIS — Z1329 Encounter for screening for other suspected endocrine disorder: Secondary | ICD-10-CM | POA: Diagnosis not present

## 2022-04-08 DIAGNOSIS — E8881 Metabolic syndrome: Secondary | ICD-10-CM

## 2022-04-08 DIAGNOSIS — Z6835 Body mass index (BMI) 35.0-35.9, adult: Secondary | ICD-10-CM

## 2022-04-08 DIAGNOSIS — E785 Hyperlipidemia, unspecified: Secondary | ICD-10-CM

## 2022-04-08 DIAGNOSIS — N289 Disorder of kidney and ureter, unspecified: Secondary | ICD-10-CM | POA: Diagnosis not present

## 2022-04-08 DIAGNOSIS — Z9049 Acquired absence of other specified parts of digestive tract: Secondary | ICD-10-CM

## 2022-04-08 DIAGNOSIS — Z0000 Encounter for general adult medical examination without abnormal findings: Secondary | ICD-10-CM | POA: Diagnosis not present

## 2022-04-08 DIAGNOSIS — E7800 Pure hypercholesterolemia, unspecified: Secondary | ICD-10-CM | POA: Diagnosis not present

## 2022-04-08 DIAGNOSIS — H4031X0 Glaucoma secondary to eye trauma, right eye, stage unspecified: Secondary | ICD-10-CM

## 2022-04-08 DIAGNOSIS — R609 Edema, unspecified: Secondary | ICD-10-CM | POA: Diagnosis not present

## 2022-04-08 LAB — POCT URINALYSIS DIPSTICK
Bilirubin, UA: NEGATIVE
Blood, UA: NEGATIVE
Glucose, UA: NEGATIVE
Ketones, UA: NEGATIVE
Leukocytes, UA: NEGATIVE
Nitrite, UA: NEGATIVE
Protein, UA: NEGATIVE
Spec Grav, UA: 1.015 (ref 1.010–1.025)
Urobilinogen, UA: 0.2 E.U./dL
pH, UA: 6 (ref 5.0–8.0)

## 2022-04-08 MED ORDER — PHENTERMINE HCL 37.5 MG PO CAPS: 37.5000 mg | ORAL_CAPSULE | ORAL | 0 refills | Status: DC

## 2022-04-08 NOTE — Patient Instructions (Signed)
Phentermine refilled at patient request.

## 2022-04-08 NOTE — Progress Notes (Unsigned)
   Subjective:    Patient ID: Allison Shields, female    DOB: [birth year], 66 y.o.   MRN: [id number]  HPI 66 year old Female     Review of Systems     Objective:   Physical Exam        Assessment & Plan:

## 2022-04-09 LAB — CBC WITH DIFFERENTIAL/PLATELET
Absolute Monocytes: 518 cells/uL (ref 200–950)
Basophils Absolute: 50 cells/uL (ref 0–200)
Basophils Relative: 0.7 %
Eosinophils Absolute: 79 cells/uL (ref 15–500)
Eosinophils Relative: 1.1 %
HCT: 37.1 % (ref 35.0–45.0)
Hemoglobin: 12.5 g/dL (ref 11.7–15.5)
Lymphs Abs: 2052 cells/uL (ref 850–3900)
MCH: 31 pg (ref 27.0–33.0)
MCHC: 33.7 g/dL (ref 32.0–36.0)
MCV: 92.1 fL (ref 80.0–100.0)
MPV: 10.8 fL (ref 7.5–12.5)
Monocytes Relative: 7.2 %
Neutro Abs: 4500 cells/uL (ref 1500–7800)
Neutrophils Relative %: 62.5 %
Platelets: 277 10*3/uL (ref 140–400)
RBC: 4.03 10*6/uL (ref 3.80–5.10)
RDW: 11.9 % (ref 11.0–15.0)
Total Lymphocyte: 28.5 %
WBC: 7.2 10*3/uL (ref 3.8–10.8)

## 2022-04-09 LAB — COMPLETE METABOLIC PANEL WITH GFR
AG Ratio: 0.8 (calc) — ABNORMAL LOW (ref 1.0–2.5)
ALT: 9 U/L (ref 6–29)
AST: 13 U/L (ref 10–35)
Albumin: 3.8 g/dL (ref 3.6–5.1)
Alkaline phosphatase (APISO): 64 U/L (ref 37–153)
BUN/Creatinine Ratio: 18 (calc) (ref 6–22)
BUN: 24 mg/dL (ref 7–25)
CO2: 23 mmol/L (ref 20–32)
Calcium: 9.7 mg/dL (ref 8.6–10.4)
Chloride: 101 mmol/L (ref 98–110)
Creat: 1.3 mg/dL — ABNORMAL HIGH (ref 0.50–1.05)
Globulin: 4.7 g/dL (calc) — ABNORMAL HIGH (ref 1.9–3.7)
Glucose, Bld: 94 mg/dL (ref 65–99)
Potassium: 4.2 mmol/L (ref 3.5–5.3)
Sodium: 135 mmol/L (ref 135–146)
Total Bilirubin: 0.6 mg/dL (ref 0.2–1.2)
Total Protein: 8.5 g/dL — ABNORMAL HIGH (ref 6.1–8.1)
eGFR: 46 mL/min/{1.73_m2} — ABNORMAL LOW (ref >=60)

## 2022-04-09 LAB — LIPID PANEL
Cholesterol: 204 mg/dL — ABNORMAL HIGH (ref <200)
HDL: 70 mg/dL (ref >=50)
LDL Cholesterol (Calc): 117 mg/dL (calc) — ABNORMAL HIGH
Non-HDL Cholesterol (Calc): 134 mg/dL (calc) — ABNORMAL HIGH (ref <130)
Total CHOL/HDL Ratio: 2.9 (calc) (ref <5.0)
Triglycerides: 80 mg/dL (ref <150)

## 2022-04-09 LAB — HEMOGLOBIN A1C
Hgb A1c MFr Bld: 6.3 % of total Hgb — ABNORMAL HIGH (ref <5.7)
Mean Plasma Glucose: 134 mg/dL
eAG (mmol/L): 7.4 mmol/L

## 2022-04-09 LAB — MICROALBUMIN, URINE: Microalb, Ur: 0.2 mg/dL

## 2022-04-09 LAB — TSH: TSH: 1.16 mIU/L (ref 0.40–4.50)

## 2022-04-11 ENCOUNTER — Telehealth: Payer: Self-pay

## 2022-04-11 NOTE — Telephone Encounter (Signed)
Patient informed of message and is scheduled 04/15/22 @ 3pm for ultrasound

## 2022-04-12 ENCOUNTER — Other Ambulatory Visit: Payer: Self-pay | Admitting: Internal Medicine

## 2022-04-12 ENCOUNTER — Other Ambulatory Visit (HOSPITAL_COMMUNITY): Payer: Self-pay

## 2022-04-12 MED ORDER — SIMVASTATIN 40 MG PO TABS
40.0000 mg | ORAL_TABLET | Freq: Every day | ORAL | 3 refills | Status: DC
  Filled 2022-04-12: qty 90, 90d supply, fill #0
  Filled 2022-06-27: qty 90, 90d supply, fill #1
  Filled 2022-10-13: qty 90, 90d supply, fill #2
  Filled 2023-02-01: qty 90, 90d supply, fill #3

## 2022-04-14 ENCOUNTER — Other Ambulatory Visit: Payer: 59

## 2022-04-15 ENCOUNTER — Other Ambulatory Visit: Payer: 59

## 2022-04-15 ENCOUNTER — Ambulatory Visit
Admission: RE | Admit: 2022-04-15 | Discharge: 2022-04-15 | Disposition: A | Discharge status: Home / Self Care | Payer: 59 | Source: Ambulatory Visit | Attending: Internal Medicine | Admitting: Internal Medicine

## 2022-04-15 DIAGNOSIS — N281 Cyst of kidney, acquired: Secondary | ICD-10-CM | POA: Diagnosis not present

## 2022-04-15 DIAGNOSIS — R7989 Other specified abnormal findings of blood chemistry: Secondary | ICD-10-CM

## 2022-04-15 DIAGNOSIS — N2889 Other specified disorders of kidney and ureter: Secondary | ICD-10-CM | POA: Diagnosis not present

## 2022-04-15 LAB — CBC WITH DIFFERENTIAL/PLATELET
Absolute Monocytes: 518 cells/uL (ref 200–950)
Basophils Absolute: 37 cells/uL (ref 0–200)
Basophils Relative: 0.5 %
Eosinophils Absolute: 81 cells/uL (ref 15–500)
Eosinophils Relative: 1.1 %
HCT: 34.9 % — ABNORMAL LOW (ref 35.0–45.0)
Hemoglobin: 11.8 g/dL (ref 11.7–15.5)
Lymphs Abs: 2294 cells/uL (ref 850–3900)
MCH: 30.9 pg (ref 27.0–33.0)
MCHC: 33.8 g/dL (ref 32.0–36.0)
MCV: 91.4 fL (ref 80.0–100.0)
MPV: 10.8 fL (ref 7.5–12.5)
Monocytes Relative: 7 %
Neutro Abs: 4470 cells/uL (ref 1500–7800)
Neutrophils Relative %: 60.4 %
Platelets: 259 10*3/uL (ref 140–400)
RBC: 3.82 10*6/uL (ref 3.80–5.10)
RDW: 11.9 % (ref 11.0–15.0)
Total Lymphocyte: 31 %
WBC: 7.4 10*3/uL (ref 3.8–10.8)

## 2022-04-19 ENCOUNTER — Encounter: Payer: Self-pay | Admitting: Internal Medicine

## 2022-04-19 ENCOUNTER — Other Ambulatory Visit (HOSPITAL_COMMUNITY): Payer: Self-pay

## 2022-04-19 ENCOUNTER — Ambulatory Visit: Payer: 59 | Admitting: Internal Medicine

## 2022-04-19 VITALS — BP 108/76 | HR 70 | Temp 97.7°F | Ht 62.5 in | Wt 193.1 lb

## 2022-04-19 DIAGNOSIS — R7302 Impaired glucose tolerance (oral): Secondary | ICD-10-CM

## 2022-04-19 DIAGNOSIS — R7989 Other specified abnormal findings of blood chemistry: Secondary | ICD-10-CM | POA: Diagnosis not present

## 2022-04-19 DIAGNOSIS — R93429 Abnormal radiologic findings on diagnostic imaging of unspecified kidney: Secondary | ICD-10-CM | POA: Diagnosis not present

## 2022-04-19 LAB — BASIC METABOLIC PANEL
BUN/Creatinine Ratio: 18 (calc) (ref 6–22)
BUN: 20 mg/dL (ref 7–25)
CO2: 28 mmol/L (ref 20–32)
Calcium: 9.4 mg/dL (ref 8.6–10.4)
Chloride: 101 mmol/L (ref 98–110)
Creat: 1.09 mg/dL — ABNORMAL HIGH (ref 0.50–1.05)
Glucose, Bld: 91 mg/dL (ref 65–99)
Potassium: 3.9 mmol/L (ref 3.5–5.3)
Sodium: 137 mmol/L (ref 135–146)

## 2022-04-19 MED ORDER — SITAGLIPTIN PHOSPHATE 50 MG PO TABS
50.0000 mg | ORAL_TABLET | Freq: Every day | ORAL | 0 refills | Status: DC
  Filled 2022-04-19: qty 30, 30d supply, fill #0

## 2022-04-20 ENCOUNTER — Other Ambulatory Visit (HOSPITAL_COMMUNITY): Payer: Self-pay

## 2022-05-10 DIAGNOSIS — R93429 Abnormal radiologic findings on diagnostic imaging of unspecified kidney: Secondary | ICD-10-CM | POA: Insufficient documentation

## 2022-05-10 NOTE — Patient Instructions (Addendum)
Your creatinine is improved having come to the office well-hydrated today.  Creatinine is stable at 1.09 and improved from 1.37.  We have arranged MRI to evaluate possible complex cystic mass left kidney.  It has been scheduled for December 6.  Regarding glucose intolerance she will start Januvia 50 mg daily and follow-up in about 4 months with hemoglobin A1c and office visit.

## 2022-05-10 NOTE — Progress Notes (Signed)
   Subjective:    Patient ID: Allison Shields, female    DOB: 05-Oct-1955, 66 y.o.   MRN: [id number]  HPI Patient was here on October 27 for health maintenance exam and evaluation of medical issues.  Basic metabolic panel showed her serum creatinine to be 1.30.  This was higher than previous readings over the past couple of years.  She did have elevated serum creatinine in 2021 but was hospitalized with acute cholecystitis at that time and had laparoscopic cholecystectomy.  Serum creatinine subsequently improved to 1.04 in October 2024.  My feeling is that her serum creatinine was elevated in late October due to fasting state.  She was advised to come well-hydrated today and we will repeat her serum creatinine.  She is also being evaluated for a 1.4 cm complex cystic mass in the superior pole of the left kidney.  Radiologist is recommending MRI for further characterization.  She is asymptomatic.  Addendum: MRI scheduled for May 18, 2022.  Patient feels well and has no new complaints.  She has no abdominal pain and does not even know that she has a kidney issue at all.  She had routine health maintenance exam on October 27-please see previous dictation from that date.  She has a history of glaucoma, pure hypercholesterolemia, impaired glucose tolerance, dependent edema.  History of bilateral cataracts.  She occasionally takes meloxicam for musculoskeletal pain.  She takes Maxide 75 daily for dependent edema.    Review of Systems no chest pain, no shortness of breath, no abdominal pain     Objective:   Physical Exam Blood pressure excellent at 108/76, pulse 70 regular, temperature 97.7 degrees by ear thermometer, pulse oximetry 98% weight 193 pounds 1.9 ounces BMI 34.76  Chest clear.  Cardiac exam: Regular rate and rhythm without ectopy.  Abdomen no hepatosplenomegaly masses or tenderness whatsoever.  Trace lower extremity edema.       Assessment & Plan:  Patient is to have MRI to  evaluate possible mass in left kidney in early December.  This was discussed with her today.  Elevated serum creatinine-repeat creatinine since she is well-hydrated.  Creatinine today is 1.09 and was 1.30 on October 27.  Patient reassured there is not evidence of chronic kidney disease.  Impaired glucose tolerance-hemoglobin A1c in October was 6.3%.  She will start Januvia 50 mg daily.  Follow-up with hemoglobin A1c and office visit in about 4 months.

## 2022-05-13 ENCOUNTER — Other Ambulatory Visit: Payer: 59

## 2022-05-18 ENCOUNTER — Ambulatory Visit
Admission: RE | Admit: 2022-05-18 | Discharge: 2022-05-18 | Disposition: A | Discharge status: Home / Self Care | Payer: 59 | Source: Ambulatory Visit | Attending: Internal Medicine | Admitting: Internal Medicine

## 2022-05-18 DIAGNOSIS — C649 Malignant neoplasm of unspecified kidney, except renal pelvis: Secondary | ICD-10-CM | POA: Diagnosis not present

## 2022-05-18 DIAGNOSIS — R7989 Other specified abnormal findings of blood chemistry: Secondary | ICD-10-CM

## 2022-05-18 MED ORDER — GADOPICLENOL 0.5 MMOL/ML IV SOLN
9.0000 mL | Freq: Once | INTRAVENOUS | Status: AC | PRN
  Administered 2022-05-18: 9 mL via INTRAVENOUS

## 2022-05-20 NOTE — Progress Notes (Signed)
Placed referral to oncology with a note, also left a voice mail to call me

## 2022-05-27 ENCOUNTER — Other Ambulatory Visit: Payer: Self-pay

## 2022-05-27 ENCOUNTER — Other Ambulatory Visit (HOSPITAL_COMMUNITY): Payer: Self-pay

## 2022-05-27 ENCOUNTER — Encounter: Payer: Self-pay | Admitting: Internal Medicine

## 2022-05-27 ENCOUNTER — Ambulatory Visit (INDEPENDENT_AMBULATORY_CARE_PROVIDER_SITE_OTHER): Payer: 59 | Admitting: Internal Medicine

## 2022-05-27 VITALS — BP 118/70 | HR 69 | Temp 98.0°F | Ht 62.5 in | Wt 194.0 lb

## 2022-05-27 DIAGNOSIS — N2889 Other specified disorders of kidney and ureter: Secondary | ICD-10-CM | POA: Diagnosis not present

## 2022-05-27 DIAGNOSIS — R7302 Impaired glucose tolerance (oral): Secondary | ICD-10-CM | POA: Diagnosis not present

## 2022-05-27 MED ORDER — SITAGLIPTIN PHOSPHATE 25 MG PO TABS
25.0000 mg | ORAL_TABLET | Freq: Every day | ORAL | 0 refills | Status: DC
  Filled 2022-05-27 – 2022-06-01 (×2): qty 30, 30d supply, fill #0
  Filled 2022-06-27: qty 30, 30d supply, fill #1
  Filled 2022-08-01: qty 30, 30d supply, fill #2

## 2022-05-27 MED ORDER — PHENTERMINE HCL 37.5 MG PO CAPS
37.5000 mg | ORAL_CAPSULE | ORAL | 0 refills | Status: DC
  Filled 2022-05-27: qty 30, 30d supply, fill #0

## 2022-05-27 MED ORDER — SITAGLIPTIN PHOSPHATE 50 MG PO TABS
50.0000 mg | ORAL_TABLET | Freq: Every day | ORAL | 0 refills | Status: DC
  Filled 2022-05-27: qty 90, 90d supply, fill #0

## 2022-05-27 NOTE — Patient Instructions (Addendum)
Referral to [facility] for consultation regarding renal mass. Stop putting sugar in coffee. Uses Splenda. RTC in March for Hgb AIC and OV. Continue Januvia 25 mg daily for now. Rx send. Refill Phentermine at her request.

## 2022-05-27 NOTE — Progress Notes (Signed)
Subjective:    Patient ID: Allison Shields, female    DOB: 1956/01/28, 66 y.o.   MRN: [id number]  HPI Patient here with her sister, Allison Shields to discuss recent renal MRI.   She has a  lesion T2 hyperintense left upper pole measuring 16 mm- which is not well-seen due to motion artifact but demonstrates some T1 hyperintensity.  No definite postcontrast enhancement identified.  This lesion was first identified when she was admitted for gallstone pancreatitis in April 2021.  It has not been followed up until recently.  There is a well-circumscribed T2 hyperintense 10 mm right lower pole renal lesion that we have been concerned about without definite postcontrast enhancement but once again lesion is limited by motion.  She is asymptomatic with regard to these lesions and urine specimen obtained here in October showed no evidence of hematuria.  She has no back pain, nausea vomiting or weight loss.  She does have impaired glucose tolerance and has been taking metformin and was started at last visit on low-dose Januvia.  She is also on simvastatin for hyperlipidemia.  History of dependent edema treated with Maxide 75.  NO history of hypertension.  In April 2021 she had gallstone pancreatitis resulting in laparoscopic cholecystectomy.  She has a history of pure hypercholesterolemia dating back to 2012 treated with statin medication.  History of impaired glucose tolerance.  History of dependent edema treated with Maxide 75 mg daily.  No history of hypertension.  Had right eye surgery for glaucoma in 2008.  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  History of bilateral cataracts followed by [facility].  When she was admitted for gallstone pancreatitis her creatinine increased to 2.43 but improved to 1.24 and subsequently normalized.  Social history: Employed in Water engineer at [employer].  Does not smoke or consume alcohol.  She has a high school education.  She has 2 sons and  a granddaughter.  She is separated from her husband.  Family history: Father died with a stroke with history of diabetes.  Mother with history of hypertension, glaucoma, hyperlipidemia, rheumatoid arthritis and essential thrombocytosis.  1 brother alive and well.  1 sister with remote history of  sarcoidosis that resolved.  1 sister died of GI malignancy.  Another sister with history of morbid obesity.  Total of 7 sisters.  She had colonoscopy in 2018 and 10-year follow-up recommended.  This issue with questionable mass left kidney 1.2 cm not compatible with simple cyst was identified on CT of abdomen and pelvis in April 2021.  This is when she presented to the emergency department with acute cholecystitis.  A solid neoplastic mass or complex cyst could not be excluded and Radiology recommended renal MRI.  Patient did not present here again until October 2021.  We did not pursue MRI of kidney after that visit.  Other issues were addressed.  She has been doing well in the interim.  This renal finding is concerning to patient due to sister dying from GI malignancy.  Review of Systems-she feels well and has no abdominal pain, back pain, urinary frequency or urgency.     Objective:   Physical Exam Blood pressure 118/70 pulse 69 temperature 98 degrees pulse oximetry 98% weight 194 pounds.  BMI 34.92 She feels well and has no new complaints.  Chest clear.  Cardiac exam regular rate and rhythm.  No lower extremity edema.      Assessment & Plan:  10 mm right lower pole renal lesion  that has apparently not changed significantly since 2021.  However they cannot clearly categorize this lesion.  They are suggesting repeat renal CT in 3 to 6 months with and without contrast.  Patient worried about cancer with family history of GI cancer.  Radiologist recommending repeat study in 3 to 6 months because although a cyst is favored it is not definitely characterized per radiology report 05/18/2022 read by Dr.  Nance Pew.  I would like for her Urologist to see her.  I will ask for Urology consultation in the interim but it is my feeling that this lesion has not significantly changed over the past year and a half.  I would like to have consultation with urologist to see best way to follow-up with this finding.  Glucose intolerance: Patient has been using a lot of sugar in her coffee.  I recommended she use Splenda instead.  She has mild glucose intolerance which will be treated with low-dose Januvia and metformin.  Follow-up in March.

## 2022-05-28 LAB — MICROALBUMIN / CREATININE URINE RATIO
Creatinine, Urine: 113 mg/dL (ref 20–275)
Microalb, Ur: <0.2 mg/dL

## 2022-05-30 ENCOUNTER — Other Ambulatory Visit (HOSPITAL_COMMUNITY): Payer: Self-pay

## 2022-06-01 ENCOUNTER — Other Ambulatory Visit (HOSPITAL_COMMUNITY): Payer: Self-pay

## 2022-06-02 ENCOUNTER — Other Ambulatory Visit (HOSPITAL_COMMUNITY): Payer: Self-pay

## 2022-06-02 DIAGNOSIS — D4102 Neoplasm of uncertain behavior of left kidney: Secondary | ICD-10-CM | POA: Diagnosis not present

## 2022-06-02 DIAGNOSIS — N281 Cyst of kidney, acquired: Secondary | ICD-10-CM | POA: Diagnosis not present

## 2022-06-08 ENCOUNTER — Other Ambulatory Visit: Payer: Self-pay | Admitting: Internal Medicine

## 2022-06-08 ENCOUNTER — Other Ambulatory Visit (HOSPITAL_COMMUNITY): Payer: Self-pay

## 2022-06-08 MED ORDER — METFORMIN HCL ER 500 MG PO TB24
500.0000 mg | ORAL_TABLET | Freq: Every day | ORAL | 1 refills | Status: DC
  Filled 2022-06-08: qty 90, 90d supply, fill #0
  Filled 2022-09-17: qty 90, 90d supply, fill #1

## 2022-06-14 ENCOUNTER — Other Ambulatory Visit (HOSPITAL_COMMUNITY): Payer: Self-pay

## 2022-06-15 ENCOUNTER — Other Ambulatory Visit: Payer: Self-pay | Admitting: Internal Medicine

## 2022-06-15 ENCOUNTER — Telehealth: Payer: Self-pay | Admitting: Internal Medicine

## 2022-06-15 ENCOUNTER — Other Ambulatory Visit (HOSPITAL_COMMUNITY): Payer: Self-pay

## 2022-06-15 MED ORDER — TRIAMTERENE-HCTZ 75-50 MG PO TABS
1.0000 | ORAL_TABLET | Freq: Every day | ORAL | 1 refills | Status: DC
  Filled 2022-06-15 – 2022-06-20 (×2): qty 90, 90d supply, fill #0
  Filled 2022-09-17: qty 90, 90d supply, fill #1

## 2022-06-15 NOTE — Telephone Encounter (Signed)
LVM to CB to give results of Dr Tresa Moore visit

## 2022-06-15 NOTE — Telephone Encounter (Signed)
-----   Message from Elby Showers, MD sent at 06/12/2022 12:23 PM EST ----- Please let her know Dr. Tresa Moore considers this kidney lesion to be benign ----- Message ----- From: Abran Cantor Sent: 06/11/2022   8:58 PM EST To: Elby Showers, MD

## 2022-06-16 ENCOUNTER — Other Ambulatory Visit (HOSPITAL_COMMUNITY): Payer: Self-pay

## 2022-06-20 ENCOUNTER — Other Ambulatory Visit: Payer: Self-pay

## 2022-06-27 ENCOUNTER — Other Ambulatory Visit (HOSPITAL_COMMUNITY): Payer: Self-pay

## 2022-08-01 ENCOUNTER — Other Ambulatory Visit (HOSPITAL_COMMUNITY): Payer: Self-pay

## 2022-08-02 ENCOUNTER — Other Ambulatory Visit (HOSPITAL_COMMUNITY): Payer: Self-pay

## 2022-08-05 ENCOUNTER — Other Ambulatory Visit: Payer: Self-pay | Admitting: Internal Medicine

## 2022-08-05 ENCOUNTER — Ambulatory Visit
Admission: RE | Admit: 2022-08-05 | Discharge: 2022-08-05 | Disposition: A | Discharge status: Home / Self Care | Payer: 59 | Source: Ambulatory Visit | Attending: Internal Medicine | Admitting: Internal Medicine

## 2022-08-05 DIAGNOSIS — Z1231 Encounter for screening mammogram for malignant neoplasm of breast: Secondary | ICD-10-CM

## 2022-08-05 DIAGNOSIS — H4051X0 Glaucoma secondary to other eye disorders, right eye, stage unspecified: Secondary | ICD-10-CM | POA: Diagnosis not present

## 2022-08-05 DIAGNOSIS — H5202 Hypermetropia, left eye: Secondary | ICD-10-CM | POA: Diagnosis not present

## 2022-08-05 DIAGNOSIS — H524 Presbyopia: Secondary | ICD-10-CM | POA: Diagnosis not present

## 2022-08-05 LAB — HM DIABETES EYE EXAM

## 2022-08-08 ENCOUNTER — Encounter: Payer: Self-pay | Admitting: Internal Medicine

## 2022-08-23 ENCOUNTER — Other Ambulatory Visit: Payer: 59

## 2022-08-23 DIAGNOSIS — R7302 Impaired glucose tolerance (oral): Secondary | ICD-10-CM | POA: Diagnosis not present

## 2022-08-24 LAB — HEMOGLOBIN A1C
Hgb A1c MFr Bld: 6.2 % of total Hgb — ABNORMAL HIGH (ref <5.7)
Mean Plasma Glucose: 131 mg/dL
eAG (mmol/L): 7.3 mmol/L

## 2022-09-01 ENCOUNTER — Other Ambulatory Visit (HOSPITAL_COMMUNITY): Payer: Self-pay

## 2022-09-01 ENCOUNTER — Other Ambulatory Visit: Payer: Self-pay | Admitting: Internal Medicine

## 2022-09-01 ENCOUNTER — Other Ambulatory Visit: Payer: Self-pay

## 2022-09-01 MED ORDER — SITAGLIPTIN PHOSPHATE 25 MG PO TABS
25.0000 mg | ORAL_TABLET | Freq: Every day | ORAL | 1 refills | Status: DC
  Filled 2022-09-01: qty 30, 30d supply, fill #0
  Filled 2022-09-19 – 2022-10-03 (×2): qty 30, 30d supply, fill #1
  Filled 2022-11-01: qty 30, 30d supply, fill #2
  Filled 2022-12-05: qty 30, 30d supply, fill #3
  Filled 2023-01-05: qty 30, 30d supply, fill #4
  Filled 2023-02-01: qty 30, 30d supply, fill #5

## 2022-09-02 ENCOUNTER — Ambulatory Visit (INDEPENDENT_AMBULATORY_CARE_PROVIDER_SITE_OTHER): Payer: 59 | Admitting: Internal Medicine

## 2022-09-02 ENCOUNTER — Encounter: Payer: Self-pay | Admitting: Internal Medicine

## 2022-09-02 ENCOUNTER — Other Ambulatory Visit (HOSPITAL_COMMUNITY): Payer: Self-pay

## 2022-09-02 VITALS — BP 124/76 | HR 72 | Temp 98.3°F | Ht 62.5 in | Wt 188.8 lb

## 2022-09-02 DIAGNOSIS — N281 Cyst of kidney, acquired: Secondary | ICD-10-CM | POA: Diagnosis not present

## 2022-09-02 DIAGNOSIS — Z9049 Acquired absence of other specified parts of digestive tract: Secondary | ICD-10-CM | POA: Diagnosis not present

## 2022-09-02 DIAGNOSIS — Z8669 Personal history of other diseases of the nervous system and sense organs: Secondary | ICD-10-CM

## 2022-09-02 DIAGNOSIS — Z6833 Body mass index (BMI) 33.0-33.9, adult: Secondary | ICD-10-CM | POA: Diagnosis not present

## 2022-09-02 DIAGNOSIS — E7800 Pure hypercholesterolemia, unspecified: Secondary | ICD-10-CM | POA: Diagnosis not present

## 2022-09-02 DIAGNOSIS — R7302 Impaired glucose tolerance (oral): Secondary | ICD-10-CM

## 2022-09-02 MED ORDER — PHENTERMINE HCL 37.5 MG PO CAPS
37.5000 mg | ORAL_CAPSULE | ORAL | 0 refills | Status: DC
  Filled 2022-09-02: qty 30, 30d supply, fill #0

## 2022-09-02 MED ORDER — NYSTATIN 100000 UNIT/GM EX POWD
1.0000 | Freq: Three times a day (TID) | CUTANEOUS | 0 refills | Status: DC
  Filled 2022-09-02: qty 15, 5d supply, fill #0

## 2022-09-02 NOTE — Progress Notes (Signed)
Patient Care Team: Elby Showers, MD as PCP - General (Internal Medicine)  Visit Date: 09/02/22  Subjective:    Patient ID: Allison Shields , Female   DOB: January 27, 1956, 67 y.o.    MRN: [id number]   66 y.o. Female presents today for a 3 month A1c follow-up. Patient has a past medical history of dependent edema, Type 2 diabetes mellitus, glaucoma, hyperlipidemia, hypertension, Vitamin D deficiency.  History of Type 2 diabetes mellitus treated with Glucophage-XR 500 mg daily with breakfast, Januvia 25 mg daily. Takes both medications as directed at the same time. HGBA1c at 6.2% on 08/23/22, down from 6.3% on 04/08/22. Denies foot swelling, lesions on feet.  BMI at 33.98. She has been working on losing weight through healthy diet and exercise.  Followed by Dr. Alexis Frock for kidney cyst. Next follow-up 12/01/22 9:15am.  Seen by ophthalmologist on 08/05/22. Seen every 6 months for glaucoma.  Skin between right fourth and fifth toes gets wet after shower and remains wet for long periods.   Past Medical History:  Diagnosis Date   Dependent edema    Diabetes mellitus    Glaucoma    Hyperlipidemia    Hypertension    Vitamin D deficiency      Family History  Problem Relation Age of Onset   Hypertension Mother    Hyperlipidemia Mother    Diabetes Father    Stroke Father    Cancer Sister    Colon cancer Paternal Uncle    Esophageal cancer Neg Hx    Rectal cancer Neg Hx    Stomach cancer Neg Hx     Social History   Social History Narrative   Not on file      Review of Systems  Constitutional:  Negative for fever and malaise/fatigue.  HENT:  Negative for congestion.   Eyes:  Negative for blurred vision.  Respiratory:  Negative for cough and shortness of breath.   Cardiovascular:  Negative for chest pain, palpitations and leg swelling.  Gastrointestinal:  Negative for vomiting.  Musculoskeletal:  Negative for back pain.  Skin:  Negative for rash.       (-) Bilateral  foot lesions  Neurological:  Negative for loss of consciousness and headaches.        Objective:   Vitals: BP 124/76   Pulse 72   Temp 98.3 F (36.8 C) (Tympanic)   Ht 5' 2.5" (1.588 m)   Wt 188 lb 12.8 oz (85.6 kg)   SpO2 99%   BMI 33.98 kg/m    Physical Exam Vitals and nursing note reviewed.  Constitutional:      General: She is not in acute distress.    Appearance: Normal appearance. She is not toxic-appearing.  HENT:     Head: Normocephalic and atraumatic.  Neck:     Thyroid: No thyroid mass, thyromegaly or thyroid tenderness.     Vascular: No carotid bruit.  Cardiovascular:     Rate and Rhythm: Normal rate and regular rhythm. No extrasystoles are present.    Pulses:          Dorsalis pedis pulses are 1+ on the right side and 1+ on the left side.     Heart sounds: Normal heart sounds. No murmur heard.    No friction rub. No gallop.  Pulmonary:     Effort: Pulmonary effort is normal. No respiratory distress.     Breath sounds: Normal breath sounds. No wheezing or rales.  Feet:  Comments: Macerated area between right fourth and fifth toe. Pt has been disturbing the area. Does not look infected. Lymphadenopathy:     Cervical: No cervical adenopathy.  Skin:    General: Skin is warm and dry.  Neurological:     Mental Status: She is alert and oriented to person, place, and time. Mental status is at baseline.  Psychiatric:        Mood and Affect: Mood normal.        Behavior: Behavior normal.        Thought Content: Thought content normal.        Judgment: Judgment normal.       Results:   Studies obtained and personally reviewed by me:   Labs:       Component Value Date/Time   NA 137 04/19/2022 1215   K 3.9 04/19/2022 1215   CL 101 04/19/2022 1215   CO2 28 04/19/2022 1215   GLUCOSE 91 04/19/2022 1215   BUN 20 04/19/2022 1215   CREATININE 1.09 (H) 04/19/2022 1215   CALCIUM 9.4 04/19/2022 1215   PROT 8.5 (H) 04/08/2022 1137   ALBUMIN 2.0 (L)  09/17/2019 0739   AST 13 04/08/2022 1137   ALT 9 04/08/2022 1137   ALKPHOS 123 09/17/2019 0739   BILITOT 0.6 04/08/2022 1137   GFRNONAA 58 (L) 04/03/2020 1051   GFRAA 67 04/03/2020 1051     Lab Results  Component Value Date   WBC 7.4 04/15/2022   HGB 11.8 04/15/2022   HCT 34.9 (L) 04/15/2022   MCV 91.4 04/15/2022   PLT 259 04/15/2022    Lab Results  Component Value Date   CHOL 204 (H) 04/08/2022   HDL 70 04/08/2022   LDLCALC 117 (H) 04/08/2022   TRIG 80 04/08/2022   CHOLHDL 2.9 04/08/2022    Lab Results  Component Value Date   HGBA1C 6.2 (H) 08/23/2022     Lab Results  Component Value Date   TSH 1.16 04/08/2022      Assessment & Plan:   Type 2 diabetes: mellitus treated with Glucophage-XR 500 mg daily with breakfast, Januvia 25 mg daily. Takes both medications as directed at the same time. HGBA1c at 6.2% on 08/23/22, down from 6.3% on 04/08/22.  BMI at 33.98: She has been working on losing weight through healthy diet and exercise. Refilled phentermine 37.5 mg once daily in the morning (30).  Kidney cyst: Followed by Dr. Alexis Frock. Next follow-up 12/01/22 9:15am.  Glaucoma: Seen by ophthalmologist on 08/05/22. Seen every 6 months.  Macerated area between right fourth and fifth toe: prescribed nystatin powder apply once three times daily.    I,Alexander Ruley,acting as a Education administrator for Elby Showers, MD.,have documented all relevant documentation on the behalf of Elby Showers, MD,as directed by  Elby Showers, MD while in the presence of Elby Showers, MD.   I, Elby Showers, MD, have reviewed all documentation for this visit. The documentation on 09/06/22 for the exam, diagnosis, procedures, and orders are all accurate and complete.

## 2022-09-03 ENCOUNTER — Other Ambulatory Visit (HOSPITAL_COMMUNITY): Payer: Self-pay

## 2022-09-17 ENCOUNTER — Other Ambulatory Visit (HOSPITAL_COMMUNITY): Payer: Self-pay

## 2022-09-19 ENCOUNTER — Other Ambulatory Visit (HOSPITAL_COMMUNITY): Payer: Self-pay

## 2022-10-03 ENCOUNTER — Other Ambulatory Visit: Payer: Self-pay | Admitting: Internal Medicine

## 2022-10-03 ENCOUNTER — Other Ambulatory Visit (HOSPITAL_COMMUNITY): Payer: Self-pay

## 2022-10-03 MED ORDER — NYSTATIN 100000 UNIT/GM EX POWD
1.0000 | Freq: Three times a day (TID) | CUTANEOUS | 0 refills | Status: DC
  Filled 2022-10-03: qty 15, 5d supply, fill #0

## 2022-10-04 ENCOUNTER — Other Ambulatory Visit: Payer: Self-pay

## 2022-10-04 ENCOUNTER — Other Ambulatory Visit (HOSPITAL_COMMUNITY): Payer: Self-pay

## 2022-10-05 ENCOUNTER — Other Ambulatory Visit: Payer: Self-pay

## 2022-10-05 ENCOUNTER — Other Ambulatory Visit (HOSPITAL_COMMUNITY): Payer: Self-pay

## 2022-10-13 ENCOUNTER — Other Ambulatory Visit (HOSPITAL_COMMUNITY): Payer: Self-pay

## 2022-11-01 ENCOUNTER — Other Ambulatory Visit (HOSPITAL_COMMUNITY): Payer: Self-pay

## 2022-11-03 ENCOUNTER — Other Ambulatory Visit (HOSPITAL_COMMUNITY): Payer: Self-pay

## 2022-11-08 ENCOUNTER — Other Ambulatory Visit (HOSPITAL_COMMUNITY): Payer: Self-pay

## 2022-11-08 DIAGNOSIS — M25512 Pain in left shoulder: Secondary | ICD-10-CM | POA: Diagnosis not present

## 2022-11-08 MED ORDER — MELOXICAM 15 MG PO TABS
15.0000 mg | ORAL_TABLET | Freq: Every day | ORAL | 0 refills | Status: DC | PRN
  Filled 2022-11-08: qty 30, 30d supply, fill #0

## 2022-11-24 ENCOUNTER — Other Ambulatory Visit (HOSPITAL_COMMUNITY): Payer: Self-pay

## 2022-11-24 DIAGNOSIS — M7918 Myalgia, other site: Secondary | ICD-10-CM | POA: Diagnosis not present

## 2022-11-24 DIAGNOSIS — M25512 Pain in left shoulder: Secondary | ICD-10-CM | POA: Diagnosis not present

## 2022-11-24 DIAGNOSIS — M542 Cervicalgia: Secondary | ICD-10-CM | POA: Diagnosis not present

## 2022-11-24 MED ORDER — GABAPENTIN 300 MG PO CAPS
300.0000 mg | ORAL_CAPSULE | Freq: Every day | ORAL | 1 refills | Status: AC
  Filled 2022-11-24: qty 30, 30d supply, fill #0
  Filled 2023-02-01 – 2023-06-19 (×2): qty 30, 30d supply, fill #1

## 2022-11-25 DIAGNOSIS — N281 Cyst of kidney, acquired: Secondary | ICD-10-CM | POA: Diagnosis not present

## 2022-12-01 DIAGNOSIS — D4102 Neoplasm of uncertain behavior of left kidney: Secondary | ICD-10-CM | POA: Diagnosis not present

## 2022-12-01 DIAGNOSIS — N281 Cyst of kidney, acquired: Secondary | ICD-10-CM | POA: Diagnosis not present

## 2022-12-05 ENCOUNTER — Other Ambulatory Visit (HOSPITAL_COMMUNITY): Payer: Self-pay

## 2022-12-09 DIAGNOSIS — Z9049 Acquired absence of other specified parts of digestive tract: Secondary | ICD-10-CM | POA: Diagnosis not present

## 2022-12-09 DIAGNOSIS — N289 Disorder of kidney and ureter, unspecified: Secondary | ICD-10-CM | POA: Diagnosis not present

## 2022-12-09 DIAGNOSIS — I700 Atherosclerosis of aorta: Secondary | ICD-10-CM | POA: Diagnosis not present

## 2022-12-09 DIAGNOSIS — N281 Cyst of kidney, acquired: Secondary | ICD-10-CM | POA: Diagnosis not present

## 2022-12-13 DIAGNOSIS — N281 Cyst of kidney, acquired: Secondary | ICD-10-CM | POA: Diagnosis not present

## 2022-12-13 DIAGNOSIS — D4102 Neoplasm of uncertain behavior of left kidney: Secondary | ICD-10-CM | POA: Diagnosis not present

## 2022-12-16 ENCOUNTER — Other Ambulatory Visit (HOSPITAL_COMMUNITY): Payer: Self-pay

## 2022-12-16 ENCOUNTER — Other Ambulatory Visit: Payer: Self-pay | Admitting: Internal Medicine

## 2022-12-16 MED ORDER — METFORMIN HCL ER 500 MG PO TB24
500.0000 mg | ORAL_TABLET | Freq: Every day | ORAL | 1 refills | Status: DC
  Filled 2022-12-16: qty 90, 90d supply, fill #0
  Filled 2023-03-10: qty 90, 90d supply, fill #1

## 2022-12-16 MED ORDER — TRIAMTERENE-HCTZ 75-50 MG PO TABS
1.0000 | ORAL_TABLET | Freq: Every day | ORAL | 1 refills | Status: DC
  Filled 2022-12-16: qty 90, 90d supply, fill #0
  Filled 2023-03-10: qty 90, 90d supply, fill #1

## 2023-01-04 ENCOUNTER — Other Ambulatory Visit (HOSPITAL_COMMUNITY): Payer: Self-pay

## 2023-01-04 ENCOUNTER — Other Ambulatory Visit: Payer: Self-pay | Admitting: Internal Medicine

## 2023-01-04 MED ORDER — NYSTATIN 100000 UNIT/GM EX POWD
1.0000 | Freq: Three times a day (TID) | CUTANEOUS | 0 refills | Status: DC
  Filled 2023-01-04: qty 15, 5d supply, fill #0

## 2023-01-05 ENCOUNTER — Other Ambulatory Visit (HOSPITAL_COMMUNITY): Payer: Self-pay

## 2023-02-01 ENCOUNTER — Other Ambulatory Visit (HOSPITAL_COMMUNITY): Payer: Self-pay

## 2023-02-02 ENCOUNTER — Other Ambulatory Visit (HOSPITAL_COMMUNITY): Payer: Self-pay

## 2023-02-07 DIAGNOSIS — H2513 Age-related nuclear cataract, bilateral: Secondary | ICD-10-CM | POA: Diagnosis not present

## 2023-02-07 DIAGNOSIS — H4051X1 Glaucoma secondary to other eye disorders, right eye, mild stage: Secondary | ICD-10-CM | POA: Diagnosis not present

## 2023-03-10 ENCOUNTER — Other Ambulatory Visit (HOSPITAL_COMMUNITY): Payer: Self-pay

## 2023-03-10 ENCOUNTER — Other Ambulatory Visit: Payer: Self-pay

## 2023-03-10 ENCOUNTER — Other Ambulatory Visit: Payer: Self-pay | Admitting: Internal Medicine

## 2023-03-10 MED ORDER — SITAGLIPTIN PHOSPHATE 25 MG PO TABS
25.0000 mg | ORAL_TABLET | Freq: Every day | ORAL | 1 refills | Status: DC
  Filled 2023-03-10: qty 30, 30d supply, fill #0
  Filled 2023-04-10: qty 30, 30d supply, fill #1
  Filled 2023-05-15: qty 30, 30d supply, fill #2
  Filled 2023-06-19: qty 30, 30d supply, fill #3
  Filled 2023-06-22: qty 90, 90d supply, fill #3
  Filled 2023-06-23: qty 30, 30d supply, fill #3
  Filled 2023-07-19: qty 30, 30d supply, fill #4
  Filled 2023-08-17: qty 30, 30d supply, fill #5

## 2023-03-10 MED ORDER — SIMVASTATIN 40 MG PO TABS
40.0000 mg | ORAL_TABLET | Freq: Every day | ORAL | 3 refills | Status: DC
  Filled 2023-03-10 – 2023-04-25 (×2): qty 90, 90d supply, fill #0
  Filled 2023-08-17: qty 90, 90d supply, fill #1
  Filled 2023-11-14: qty 90, 90d supply, fill #2
  Filled 2024-02-27: qty 90, 90d supply, fill #3

## 2023-03-10 MED ORDER — NYSTATIN 100000 UNIT/GM EX POWD
1.0000 | Freq: Three times a day (TID) | CUTANEOUS | 0 refills | Status: DC
  Filled 2023-03-10: qty 15, 5d supply, fill #0

## 2023-03-11 ENCOUNTER — Other Ambulatory Visit (HOSPITAL_COMMUNITY): Payer: Self-pay

## 2023-04-10 ENCOUNTER — Other Ambulatory Visit (HOSPITAL_COMMUNITY): Payer: Self-pay

## 2023-04-13 ENCOUNTER — Other Ambulatory Visit (HOSPITAL_COMMUNITY): Payer: Self-pay

## 2023-04-14 NOTE — Progress Notes (Addendum)
Patient Care Team: Margaree Mackintosh, MD as PCP - General (Internal Medicine)  Visit Date: 04/21/23  Subjective:    Patient ID: Allison Shields , Female   DOB: [birth year], 67 y.o.    MRN: [id number]   67 y.o. Female presents today for annual comprehensive physical exam. History of dependent edema, Type 2 diabetes mellitus, glaucoma, hyperlipidemia, Vitamin D deficiency.   Reports she is feeling well regarding general health.  History of degenerative spondylosis of lumbar spine, left shoulder pain, osteoarthritis right knee. She is out of meloxicam and is requesting a refill. Currently taking Aleve regularly, gabapentin 300 mg daily.  History of Type 2 diabetes mellitus treated with metformin 500 mg daily, sitagliptin 25 mg daily.   History of hyperlipidemia treated with simvastatin 40 mg daily.  History of dependent edema treated with tiamterene-hydrochlorothiazide 75-50 daily. Blood pressure normal in-office today at 120/80.  History of mild obesity. She lost 10 pounds since 05/27/22. She is very active at work. I have in the past prescribed small quantity of phentermine to assist with her weight loss efforts.  This issue with questionable mass left kidney 1.2 cm not compatible with simple cyst was identified on CT of abdomen and pelvis in April 2021.  This is when she presented to the emergency department with acute cholecystitis.  A solid neoplastic mass or complex cyst could not be excluded and Radiology recommended renal MRI.  Patient did not present here again until October 2021.  We did not pursue MRI of kidney after that visit.  Other issues were addressed.  She has been doing well in the interim.She is being followed by Dr. Berneice Heinrich at [facility]. Thought to have non complex right renal cyst and stable left upper B2 cyst less than 2 cm with no enhancement on MRI  There is a well-circumscribed T2 hyperintense 10 mm right lower pole renal lesion that we have been concerned about  without definite postcontrast enhancement but once again lesion is limited by motion.    She is asymptomatic with regard to these lesions and urine specimen obtained here in October showed no evidence of hematuria.  She has no back pain, nausea vomiting or weight loss.   In April 2021 she had gallstone pancreatitis resulting in laparoscopic cholecystectomy.  Had right eye surgery for glaucoma in 2008.  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  History of bilateral cataracts followed by [facility]. When she was admitted for gallstone pancreatitis her creatinine increased to 2.43 but improved to 1.24 and subsequently normalized. This renal finding is concerning to patient due to sister dying from GI malignancy.  Labs are pending.  Pap smear normal on 04/03/20.  Mammogram normal on 08/09/22.   She had colonoscopy in 2018 and 10-year follow-up recommended.  Social history: Employed in Public affairs consultant at [employer].  Does not smoke or consume alcohol.  She has a high school education.  She has 2 sons and a granddaughter.  She is separated from her husband.   Family history: Father died with a stroke with history of diabetes.  Mother with history of hypertension, glaucoma, hyperlipidemia, rheumatoid arthritis and essential thrombocytosis.  1 brother alive and well.  1 sister with remote history of  sarcoidosis that resolved.  1 sister died of GI malignancy.  Another sister with history of morbid obesity.  Total of 7 sisters.  Past Medical History:  Diagnosis Date   Dependent edema    Diabetes mellitus  Glaucoma    Hyperlipidemia    Hypertension    Vitamin D deficiency      Family History  Problem Relation Age of Onset   Hypertension Mother    Hyperlipidemia Mother    Diabetes Father    Stroke Father    Cancer Sister    Colon cancer Paternal Uncle    Esophageal cancer Neg Hx    Rectal cancer Neg Hx    Stomach cancer Neg Hx     Social History    Social History Narrative   Not on file      Review of Systems  Constitutional:  Negative for chills, fever, malaise/fatigue and weight loss.  HENT:  Negative for hearing loss, sinus pain and sore throat.   Respiratory:  Negative for cough, hemoptysis and shortness of breath.   Cardiovascular:  Negative for chest pain, palpitations, leg swelling and PND.  Gastrointestinal:  Negative for abdominal pain, constipation, diarrhea, heartburn, nausea and vomiting.  Genitourinary:  Negative for dysuria, frequency and urgency.  Musculoskeletal:  Positive for back pain and joint pain. Negative for myalgias and neck pain.  Skin:  Negative for itching and rash.  Neurological:  Negative for dizziness, tingling, seizures and headaches.  Endo/Heme/Allergies:  Negative for polydipsia.  Psychiatric/Behavioral:  Negative for depression. The patient is not nervous/anxious.         Objective:   Vitals: BP 120/80   Pulse 73   Ht 5' 2.5" (1.588 m)   Wt 184 lb (83.5 kg)   SpO2 97%   BMI 33.12 kg/m    Physical Exam Vitals and nursing note reviewed.  Constitutional:      General: She is not in acute distress.    Appearance: Normal appearance. She is not ill-appearing or toxic-appearing.  HENT:     Head: Normocephalic and atraumatic.     Right Ear: Hearing, tympanic membrane, ear canal and external ear normal.     Left Ear: Hearing, tympanic membrane, ear canal and external ear normal.     Mouth/Throat:     Pharynx: Oropharynx is clear.  Eyes:     Extraocular Movements: Extraocular movements intact.     Pupils: Pupils are equal, round, and reactive to light.  Neck:     Thyroid: No thyroid mass, thyromegaly or thyroid tenderness.     Vascular: No carotid bruit.  Cardiovascular:     Rate and Rhythm: Normal rate and regular rhythm. No extrasystoles are present.    Heart sounds: Normal heart sounds. No murmur heard.    No friction rub. No gallop.  Pulmonary:     Effort: Pulmonary effort  is normal.     Breath sounds: Normal breath sounds. No decreased breath sounds, wheezing, rhonchi or rales.  Chest:     Chest wall: No mass.  Abdominal:     Palpations: Abdomen is soft. There is no hepatomegaly, splenomegaly or mass.     Tenderness: There is no abdominal tenderness.     Hernia: No hernia is present.  Genitourinary:    Comments: NFEG. Bimanual exam normal. Musculoskeletal:     Cervical back: Normal range of motion.     Right lower leg: No edema.     Left lower leg: No edema.  Lymphadenopathy:     Cervical: No cervical adenopathy.     Upper Body:     Right upper body: No supraclavicular adenopathy.     Left upper body: No supraclavicular adenopathy.  Skin:    General: Skin is warm and dry.  Neurological:     General: No focal deficit present.     Mental Status: She is alert and oriented to person, place, and time. Mental status is at baseline.     Sensory: Sensation is intact.     Motor: Motor function is intact. No weakness.     Deep Tendon Reflexes: Reflexes are normal and symmetric.  Psychiatric:        Attention and Perception: Attention normal.        Mood and Affect: Mood normal.        Speech: Speech normal.        Behavior: Behavior normal.        Thought Content: Thought content normal.        Cognition and Memory: Cognition normal.        Judgment: Judgment normal.      Results:   Studies obtained and personally reviewed by me:  Pap smear normal on 04/03/20.  Mammogram normal on 08/09/22.   She had colonoscopy in 2018 and 10-year follow-up recommended.  Labs:       Component Value Date/Time   NA 137 04/19/2022 1215   K 3.9 04/19/2022 1215   CL 101 04/19/2022 1215   CO2 28 04/19/2022 1215   GLUCOSE 91 04/19/2022 1215   BUN 20 04/19/2022 1215   CREATININE 1.09 (H) 04/19/2022 1215   CALCIUM 9.4 04/19/2022 1215   PROT 8.5 (H) 04/08/2022 1137   ALBUMIN 2.0 (L) 09/17/2019 0739   AST 13 04/08/2022 1137   ALT 9 04/08/2022 1137    ALKPHOS 123 09/17/2019 0739   BILITOT 0.6 04/08/2022 1137   GFRNONAA 58 (L) 04/03/2020 1051   GFRAA 67 04/03/2020 1051     Lab Results  Component Value Date   WBC 7.4 04/15/2022   HGB 11.8 04/15/2022   HCT 34.9 (L) 04/15/2022   MCV 91.4 04/15/2022   PLT 259 04/15/2022    Lab Results  Component Value Date   CHOL 204 (H) 04/08/2022   HDL 70 04/08/2022   LDLCALC 117 (H) 04/08/2022   TRIG 80 04/08/2022   CHOLHDL 2.9 04/08/2022    Lab Results  Component Value Date   HGBA1C 6.2 (H) 08/23/2022     Lab Results  Component Value Date   TSH 1.16 04/08/2022      Assessment & Plan:   Musculoskeletal pain: refilled meloxicam. Advised against taking Aleve regularly.  Muscle spasms: treated with gabapentin 300 mg daily.  Type 2 diabetes mellitus: treated with metformin 500 mg daily, sitagliptin 25 mg daily. Stable Hgb AIC at 6.1 %  Hyperlipidemia: treated with simvastatin 40 mg daily.  Dependent edema: treated with tiamterene-hydrochlorothiazide 75-50 daily. Blood pressure normal in-office today at 120/80.  Obesity: prescribed 30 tabs of phentermine 15 mg  to help with appetite suppression. She lost 10 pounds since 05/27/22. She is very active at work. Continue diet and weight loss efforts.  Hx of glaucoma- followed by eye physician  Acquired cysts both kidneys being followed at [facility] by Dr. Berneice Heinrich.  Pap smear normal on 04/03/20.  Mammogram normal on 08/09/22.   She had colonoscopy in 2018 and 10-year follow-up recommended.  Vaccine counseling: UTD on tetanus, shingles, flu vaccines.  Return in 1 year or as needed.  I,Alexander Ruley,acting as a Neurosurgeon for Margaree Mackintosh, MD.,have documented all relevant documentation on the behalf of Margaree Mackintosh, MD,as directed by  Margaree Mackintosh, MD while in the presence of Margaree Mackintosh, MD.   I,  Margaree Mackintosh, MD, have reviewed all documentation for this visit. The documentation on 04/30/23 for the exam, diagnosis,  procedures, and orders are all accurate and complete.

## 2023-04-14 NOTE — Progress Notes (Incomplete Revision)
Patient Care Team: Margaree Mackintosh, MD as PCP - General (Internal Medicine)  Visit Date: 04/21/23  Subjective:    Patient ID: Allison Shields , Female   DOB: 03/30/1956, 67 y.o.    MRN: [id number]   67 y.o. Female presents today for annual comprehensive physical exam. History of dependent edema, Type 2 diabetes mellitus, glaucoma, hyperlipidemia, Vitamin D deficiency.   Reports she is feeling well regarding general health.  History of degenerative spondylosis of lumbar spine, left shoulder pain, osteoarthritis right knee. She is out of meloxicam and is requesting a refill. Currently taking Aleve regularly, gabapentin 300 mg daily.  History of Type 2 diabetes mellitus treated with metformin 500 mg daily, sitagliptin 25 mg daily.   History of hyperlipidemia treated with simvastatin 40 mg daily.  History of dependent edema treated with tiamterene-hydrochlorothiazide 75-50 daily. Blood pressure normal in-office today at 120/80.  History of mild obesity. She lost 10 pounds since 05/27/22. She is very active at work. I have in the past prescribed small quantity of phentermine to assist with her weight loss efforts.  This issue with questionable mass left kidney 1.2 cm not compatible with simple cyst was identified on CT of abdomen and pelvis in April 2021.  This is when she presented to the emergency department with acute cholecystitis.  A solid neoplastic mass or complex cyst could not be excluded and Radiology recommended renal MRI.  Patient did not present here again until October 2021.  We did not pursue MRI of kidney after that visit.  Other issues were addressed.  She has been doing well in the interim.  There is a well-circumscribed T2 hyperintense 10 mm right lower pole renal lesion that we have been concerned about without definite postcontrast enhancement but once again lesion is limited by motion.  She is asymptomatic with regard to these lesions and urine specimen obtained here in  October showed no evidence of hematuria.  She has no back pain, nausea vomiting or weight loss.   In April 2021 she had gallstone pancreatitis resulting in laparoscopic cholecystectomy.  Had right eye surgery for glaucoma in 2008.  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  History of bilateral cataracts followed by [facility]. When she was admitted for gallstone pancreatitis her creatinine increased to 2.43 but improved to 1.24 and subsequently normalized. This renal finding is concerning to patient due to sister dying from GI malignancy.  Labs are pending.  Pap smear normal on 04/03/20.  Mammogram normal on 08/09/22.   She had colonoscopy in 2018 and 10-year follow-up recommended.  Social history: Employed in Public affairs consultant at [employer].  Does not smoke or consume alcohol.  She has a high school education.  She has 2 sons and a granddaughter.  She is separated from her husband.   Family history: Father died with a stroke with history of diabetes.  Mother with history of hypertension, glaucoma, hyperlipidemia, rheumatoid arthritis and essential thrombocytosis.  1 brother alive and well.  1 sister with remote history of  sarcoidosis that resolved.  1 sister died of GI malignancy.  Another sister with history of morbid obesity.  Total of 7 sisters.  Past Medical History:  Diagnosis Date  . Dependent edema   . Diabetes mellitus   . Glaucoma   . Hyperlipidemia   . Hypertension   . Vitamin D deficiency      Family History  Problem Relation Age of Onset  . Hypertension Mother   .  Hyperlipidemia Mother   . Diabetes Father   . Stroke Father   . Cancer Sister   . Colon cancer Paternal Uncle   . Esophageal cancer Neg Hx   . Rectal cancer Neg Hx   . Stomach cancer Neg Hx     Social History   Social History Narrative  . Not on file      Review of Systems  Constitutional:  Negative for chills, fever, malaise/fatigue and weight loss.  HENT:   Negative for hearing loss, sinus pain and sore throat.   Respiratory:  Negative for cough, hemoptysis and shortness of breath.   Cardiovascular:  Negative for chest pain, palpitations, leg swelling and PND.  Gastrointestinal:  Negative for abdominal pain, constipation, diarrhea, heartburn, nausea and vomiting.  Genitourinary:  Negative for dysuria, frequency and urgency.  Musculoskeletal:  Positive for back pain and joint pain. Negative for myalgias and neck pain.  Skin:  Negative for itching and rash.  Neurological:  Negative for dizziness, tingling, seizures and headaches.  Endo/Heme/Allergies:  Negative for polydipsia.  Psychiatric/Behavioral:  Negative for depression. The patient is not nervous/anxious.         Objective:   Vitals: BP 120/80   Pulse 73   Ht 5' 2.5" (1.588 m)   Wt 184 lb (83.5 kg)   SpO2 97%   BMI 33.12 kg/m    Physical Exam Vitals and nursing note reviewed.  Constitutional:      General: She is not in acute distress.    Appearance: Normal appearance. She is not ill-appearing or toxic-appearing.  HENT:     Head: Normocephalic and atraumatic.     Right Ear: Hearing, tympanic membrane, ear canal and external ear normal.     Left Ear: Hearing, tympanic membrane, ear canal and external ear normal.     Mouth/Throat:     Pharynx: Oropharynx is clear.  Eyes:     Extraocular Movements: Extraocular movements intact.     Pupils: Pupils are equal, round, and reactive to light.  Neck:     Thyroid: No thyroid mass, thyromegaly or thyroid tenderness.     Vascular: No carotid bruit.  Cardiovascular:     Rate and Rhythm: Normal rate and regular rhythm. No extrasystoles are present.    Heart sounds: Normal heart sounds. No murmur heard.    No friction rub. No gallop.  Pulmonary:     Effort: Pulmonary effort is normal.     Breath sounds: Normal breath sounds. No decreased breath sounds, wheezing, rhonchi or rales.  Chest:     Chest wall: No mass.  Abdominal:      Palpations: Abdomen is soft. There is no hepatomegaly, splenomegaly or mass.     Tenderness: There is no abdominal tenderness.     Hernia: No hernia is present.  Genitourinary:    Comments: NFEG. Bimanual exam normal. Musculoskeletal:     Cervical back: Normal range of motion.     Right lower leg: No edema.     Left lower leg: No edema.  Lymphadenopathy:     Cervical: No cervical adenopathy.     Upper Body:     Right upper body: No supraclavicular adenopathy.     Left upper body: No supraclavicular adenopathy.  Skin:    General: Skin is warm and dry.  Neurological:     General: No focal deficit present.     Mental Status: She is alert and oriented to person, place, and time. Mental status is at baseline.  Sensory: Sensation is intact.     Motor: Motor function is intact. No weakness.     Deep Tendon Reflexes: Reflexes are normal and symmetric.  Psychiatric:        Attention and Perception: Attention normal.        Mood and Affect: Mood normal.        Speech: Speech normal.        Behavior: Behavior normal.        Thought Content: Thought content normal.        Cognition and Memory: Cognition normal.        Judgment: Judgment normal.      Results:   Studies obtained and personally reviewed by me:  Pap smear normal on 04/03/20.  Mammogram normal on 08/09/22.   She had colonoscopy in 2018 and 10-year follow-up recommended.  Labs:       Component Value Date/Time   NA 137 04/19/2022 1215   K 3.9 04/19/2022 1215   CL 101 04/19/2022 1215   CO2 28 04/19/2022 1215   GLUCOSE 91 04/19/2022 1215   BUN 20 04/19/2022 1215   CREATININE 1.09 (H) 04/19/2022 1215   CALCIUM 9.4 04/19/2022 1215   PROT 8.5 (H) 04/08/2022 1137   ALBUMIN 2.0 (L) 09/17/2019 0739   AST 13 04/08/2022 1137   ALT 9 04/08/2022 1137   ALKPHOS 123 09/17/2019 0739   BILITOT 0.6 04/08/2022 1137   GFRNONAA 58 (L) 04/03/2020 1051   GFRAA 67 04/03/2020 1051     Lab Results  Component Value Date    WBC 7.4 04/15/2022   HGB 11.8 04/15/2022   HCT 34.9 (L) 04/15/2022   MCV 91.4 04/15/2022   PLT 259 04/15/2022    Lab Results  Component Value Date   CHOL 204 (H) 04/08/2022   HDL 70 04/08/2022   LDLCALC 117 (H) 04/08/2022   TRIG 80 04/08/2022   CHOLHDL 2.9 04/08/2022    Lab Results  Component Value Date   HGBA1C 6.2 (H) 08/23/2022     Lab Results  Component Value Date   TSH 1.16 04/08/2022      Assessment & Plan:   Musculoskeletal pain: refilled meloxicam. Advised against taking Aleve regularly.  Muscle spasms: treated with gabapentin 300 mg daily.  Type 2 diabetes mellitus: treated with metformin 500 mg daily, sitagliptin 25 mg daily.   Hyperlipidemia: treated with simvastatin 40 mg daily.  Dependent edema: treated with tiamterene-hydrochlorothiazide 75-50 daily. Blood pressure normal in-office today at 120/80.  Obesity: taking phentermine 37.5 mg daily. Refilled. She lost 10 pounds since 05/27/22. She is very active at work.  Labs are pending. Will contact with results.  Pap smear normal on 04/03/20.  Mammogram normal on 08/09/22.   She had colonoscopy in 2018 and 10-year follow-up recommended.  Vaccine counseling: UTD on tetanus, shingles, flu vaccines.  Return in 1 year or as needed.    I,Alexander Ruley,acting as a Neurosurgeon for Margaree Mackintosh, MD.,have documented all relevant documentation on the behalf of Margaree Mackintosh, MD,as directed by  Margaree Mackintosh, MD while in the presence of Margaree Mackintosh, MD.   I, Margaree Mackintosh, MD, have reviewed all documentation for this visit. The documentation on 04/30/23 for the exam, diagnosis, procedures, and orders are all accurate and complete.

## 2023-04-21 ENCOUNTER — Other Ambulatory Visit (HOSPITAL_COMMUNITY): Payer: Self-pay

## 2023-04-21 ENCOUNTER — Other Ambulatory Visit: Payer: 59

## 2023-04-21 ENCOUNTER — Encounter: Payer: Self-pay | Admitting: Internal Medicine

## 2023-04-21 ENCOUNTER — Ambulatory Visit (INDEPENDENT_AMBULATORY_CARE_PROVIDER_SITE_OTHER): Payer: 59 | Admitting: Internal Medicine

## 2023-04-21 VITALS — BP 120/80 | HR 73 | Ht 62.5 in | Wt 184.0 lb

## 2023-04-21 DIAGNOSIS — E785 Hyperlipidemia, unspecified: Secondary | ICD-10-CM

## 2023-04-21 DIAGNOSIS — R7302 Impaired glucose tolerance (oral): Secondary | ICD-10-CM

## 2023-04-21 DIAGNOSIS — E7800 Pure hypercholesterolemia, unspecified: Secondary | ICD-10-CM

## 2023-04-21 DIAGNOSIS — H4031X0 Glaucoma secondary to eye trauma, right eye, stage unspecified: Secondary | ICD-10-CM | POA: Diagnosis not present

## 2023-04-21 DIAGNOSIS — Z0000 Encounter for general adult medical examination without abnormal findings: Secondary | ICD-10-CM | POA: Diagnosis not present

## 2023-04-21 DIAGNOSIS — N281 Cyst of kidney, acquired: Secondary | ICD-10-CM

## 2023-04-21 DIAGNOSIS — E8881 Metabolic syndrome: Secondary | ICD-10-CM | POA: Diagnosis not present

## 2023-04-21 LAB — POCT URINALYSIS DIP (CLINITEK)
Bilirubin, UA: NEGATIVE
Blood, UA: NEGATIVE
Glucose, UA: NEGATIVE mg/dL
Ketones, POC UA: NEGATIVE mg/dL
Leukocytes, UA: NEGATIVE
Nitrite, UA: NEGATIVE
POC PROTEIN,UA: NEGATIVE
Spec Grav, UA: 1.01 (ref 1.010–1.025)
Urobilinogen, UA: 0.2 U/dL
pH, UA: 6.5 (ref 5.0–8.0)

## 2023-04-21 MED ORDER — PHENTERMINE HCL 15 MG PO CAPS
15.0000 mg | ORAL_CAPSULE | ORAL | 0 refills | Status: DC
  Filled 2023-04-21: qty 30, 30d supply, fill #0

## 2023-04-21 MED ORDER — MELOXICAM 15 MG PO TABS
15.0000 mg | ORAL_TABLET | Freq: Every day | ORAL | 0 refills | Status: DC
  Filled 2023-04-21: qty 30, 30d supply, fill #0

## 2023-04-22 LAB — CBC WITH DIFFERENTIAL/PLATELET
Absolute Lymphocytes: 1928 {cells}/uL (ref 850–3900)
Absolute Monocytes: 409 {cells}/uL (ref 200–950)
Basophils Absolute: 49 {cells}/uL (ref 0–200)
Basophils Relative: 0.8 %
Eosinophils Absolute: 67 {cells}/uL (ref 15–500)
Eosinophils Relative: 1.1 %
HCT: 38.7 % (ref 35.0–45.0)
Hemoglobin: 12.4 g/dL (ref 11.7–15.5)
MCH: 30.1 pg (ref 27.0–33.0)
MCHC: 32 g/dL (ref 32.0–36.0)
MCV: 93.9 fL (ref 80.0–100.0)
MPV: 10.4 fL (ref 7.5–12.5)
Monocytes Relative: 6.7 %
Neutro Abs: 3648 {cells}/uL (ref 1500–7800)
Neutrophils Relative %: 59.8 %
Platelets: 282 10*3/uL (ref 140–400)
RBC: 4.12 10*6/uL (ref 3.80–5.10)
RDW: 11.7 % (ref 11.0–15.0)
Total Lymphocyte: 31.6 %
WBC: 6.1 10*3/uL (ref 3.8–10.8)

## 2023-04-22 LAB — COMPLETE METABOLIC PANEL WITH GFR
AG Ratio: 0.9 (calc) — ABNORMAL LOW (ref 1.0–2.5)
ALT: 10 U/L (ref 6–29)
AST: 14 U/L (ref 10–35)
Albumin: 3.9 g/dL (ref 3.6–5.1)
Alkaline phosphatase (APISO): 64 U/L (ref 37–153)
BUN: 17 mg/dL (ref 7–25)
CO2: 28 mmol/L (ref 20–32)
Calcium: 9.4 mg/dL (ref 8.6–10.4)
Chloride: 98 mmol/L (ref 98–110)
Creat: 1 mg/dL (ref 0.50–1.05)
Globulin: 4.4 g/dL — ABNORMAL HIGH (ref 1.9–3.7)
Glucose, Bld: 92 mg/dL (ref 65–99)
Potassium: 4 mmol/L (ref 3.5–5.3)
Sodium: 135 mmol/L (ref 135–146)
Total Bilirubin: 0.6 mg/dL (ref 0.2–1.2)
Total Protein: 8.3 g/dL — ABNORMAL HIGH (ref 6.1–8.1)
eGFR: 63 mL/min/{1.73_m2} (ref >=60)

## 2023-04-22 LAB — HEMOGLOBIN A1C
Hgb A1c MFr Bld: 6.1 %{Hb} — ABNORMAL HIGH (ref <5.7)
Mean Plasma Glucose: 128 mg/dL
eAG (mmol/L): 7.1 mmol/L

## 2023-04-22 LAB — LIPID PANEL
Cholesterol: 172 mg/dL (ref <200)
HDL: 68 mg/dL (ref >=50)
LDL Cholesterol (Calc): 90 mg/dL
Non-HDL Cholesterol (Calc): 104 mg/dL (ref <130)
Total CHOL/HDL Ratio: 2.5 (calc) (ref <5.0)
Triglycerides: 58 mg/dL (ref <150)

## 2023-04-22 LAB — MICROALBUMIN / CREATININE URINE RATIO
Creatinine, Urine: 83 mg/dL (ref 20–275)
Microalb Creat Ratio: 2 mg/g{creat} (ref <30)
Microalb, Ur: 0.2 mg/dL

## 2023-04-22 LAB — TSH: TSH: 0.72 m[IU]/L (ref 0.40–4.50)

## 2023-04-25 ENCOUNTER — Other Ambulatory Visit (HOSPITAL_COMMUNITY): Payer: Self-pay

## 2023-04-25 ENCOUNTER — Other Ambulatory Visit: Payer: Self-pay | Admitting: Internal Medicine

## 2023-04-25 MED ORDER — NYSTATIN 100000 UNIT/GM EX POWD
1.0000 | Freq: Three times a day (TID) | CUTANEOUS | 99 refills | Status: AC
  Filled 2023-04-25: qty 15, 5d supply, fill #0
  Filled 2023-06-19: qty 15, 5d supply, fill #1
  Filled 2023-09-06: qty 15, 5d supply, fill #2

## 2023-04-27 ENCOUNTER — Other Ambulatory Visit (HOSPITAL_COMMUNITY): Payer: Self-pay

## 2023-05-07 NOTE — Patient Instructions (Addendum)
It was a pleasure to see you today. Small quantity of Phentermine given to take sparingly to help with appetite suppression.Return in one year or as needed. Hgb Aic stable at 6.1%. Received flu vaccine through employment.Continue follow up for renal cysts as directed by Dr.Manny.  Diabetes is stable. Edema is stable. Blood pressure is normal.

## 2023-05-15 ENCOUNTER — Other Ambulatory Visit (HOSPITAL_COMMUNITY): Payer: Self-pay

## 2023-06-19 ENCOUNTER — Other Ambulatory Visit: Payer: Self-pay | Admitting: Internal Medicine

## 2023-06-19 ENCOUNTER — Other Ambulatory Visit: Payer: Self-pay

## 2023-06-19 ENCOUNTER — Other Ambulatory Visit (HOSPITAL_COMMUNITY): Payer: Self-pay

## 2023-06-19 ENCOUNTER — Other Ambulatory Visit: Payer: Self-pay | Admitting: Family

## 2023-06-19 MED ORDER — MELOXICAM 15 MG PO TABS
15.0000 mg | ORAL_TABLET | Freq: Every day | ORAL | 2 refills | Status: AC
  Filled 2023-06-19: qty 30, 30d supply, fill #0
  Filled 2024-05-21: qty 30, 30d supply, fill #1

## 2023-06-19 MED ORDER — PHENTERMINE HCL 15 MG PO CAPS
15.0000 mg | ORAL_CAPSULE | ORAL | 0 refills | Status: DC
  Filled 2023-06-19: qty 30, 30d supply, fill #0

## 2023-06-20 ENCOUNTER — Other Ambulatory Visit (HOSPITAL_COMMUNITY): Payer: Self-pay

## 2023-06-21 ENCOUNTER — Other Ambulatory Visit (HOSPITAL_COMMUNITY): Payer: Self-pay

## 2023-06-22 ENCOUNTER — Other Ambulatory Visit: Payer: Self-pay | Admitting: Internal Medicine

## 2023-06-22 ENCOUNTER — Telehealth: Payer: Self-pay | Admitting: Internal Medicine

## 2023-06-22 ENCOUNTER — Telehealth: Payer: 59 | Admitting: Internal Medicine

## 2023-06-22 ENCOUNTER — Other Ambulatory Visit (HOSPITAL_BASED_OUTPATIENT_CLINIC_OR_DEPARTMENT_OTHER): Payer: Self-pay

## 2023-06-22 ENCOUNTER — Other Ambulatory Visit: Payer: Self-pay

## 2023-06-22 ENCOUNTER — Other Ambulatory Visit (HOSPITAL_COMMUNITY): Payer: Self-pay

## 2023-06-22 MED ORDER — TRIAMTERENE-HCTZ 75-50 MG PO TABS
1.0000 | ORAL_TABLET | Freq: Every day | ORAL | 1 refills | Status: DC
  Filled 2023-06-22: qty 90, 90d supply, fill #0
  Filled 2023-09-16: qty 90, 90d supply, fill #1

## 2023-06-22 MED ORDER — METFORMIN HCL ER 500 MG PO TB24
500.0000 mg | ORAL_TABLET | Freq: Every day | ORAL | 1 refills | Status: DC
  Filled 2023-06-22: qty 90, 90d supply, fill #0
  Filled 2023-09-18: qty 90, 90d supply, fill #1

## 2023-06-22 NOTE — Telephone Encounter (Signed)
 Called patient and advised to go online and look for coupons for Januvia , and that I would also mail her the brochure that I have to see if that would help her. I explained that since we are in a new year we have to met our deductibles, that is why some of our medicines are higher now.

## 2023-06-22 NOTE — Telephone Encounter (Signed)
 Copied from CRM [id number]. Topic: Clinical - Prescription Issue >> Jun 21, 2023  3:58 PM Suzette B wrote: Reason for CRM:  sitaGLIPtin  (JANUVIA ) 25 MG tablet patient stated she was very concerned and needs some advice. Her prescription has went from 5.00 per month to over 100.00  Please call patient and advise.

## 2023-06-22 NOTE — Telephone Encounter (Signed)
 Copied from CRM [id number]. Topic: Clinical - Medication Refill >> Jun 22, 2023 10:51 AM Kinnie DEL wrote: Most Recent Primary Care Visit:  Provider: [facility]  Department: [facility]  Visit Type: LAB 5  Date: 04/21/2023  Medication: triamterene -hydrochlorothiazide  (MAXZIDE ) 75-50 MG tablet   Has the patient contacted their pharmacy? No (Agent: If no, request that the patient contact the pharmacy for the refill. If patient does not wish to contact the pharmacy document the reason why and proceed with request.) (Agent: If yes, when and what did the pharmacy advise?)  Is this the correct pharmacy for this prescription? Yes If no, delete pharmacy and type the correct one.  This is the patient's preferred pharmacy:  [facility] [address] [place] [address] Phone: [phone] Fax: [phone]  [facility] [address], [place] - [address] [place] [address] [place] [address] Phone: [phone] Fax: [phone]   Has the prescription been filled recently? No  Is the patient out of the medication? Yes  Has the patient been seen for an appointment in the last year OR does the patient have an upcoming appointment? No  Can we respond through MyChart? No  Agent: Please be advised that Rx refills may take up to 3 business days. We ask that you follow-up with your pharmacy.

## 2023-06-22 NOTE — Telephone Encounter (Signed)
 Does patient need an appointment to get refills on below medication?   triamterene-hydrochlorothiazide (MAXZIDE) 75-50 MG tablet

## 2023-06-23 ENCOUNTER — Other Ambulatory Visit (HOSPITAL_COMMUNITY): Payer: Self-pay

## 2023-06-23 NOTE — Telephone Encounter (Signed)
 LVM for the 4th time hopefully she got one of my messages. That her medicine is at the pharmacy to pick up and is affordable.

## 2023-06-23 NOTE — Telephone Encounter (Signed)
 Called patient back for the 3rd time and still got voice mail.

## 2023-06-23 NOTE — Telephone Encounter (Signed)
 Copied from CRM [id number]. Topic: General - Call Back - No Documentation >> Jun 23, 2023 10:13 AM Allison Shields wrote: Reason for CRM: Patient missed a call, poss about her medications. Please call back

## 2023-06-23 NOTE — Telephone Encounter (Signed)
 Called and spoke with [facility] the (Maxzide  ) is $0 copay with 90 day supply, the metFormin  is $15.  For 90 day supply and they are working on getting her the $5 a month coupon for [facility] for the Januvia . They will call me back and let me know how that goes.  I have called patient and left her a voice message of what I found out at the pharmacy.

## 2023-06-23 NOTE — Telephone Encounter (Signed)
 Copied from CRM [id number]. Topic: Clinical - Prescription Issue >> Jun 22, 2023  3:52 PM Carlatta H wrote: Reason for CRM: Patient stated she cannot afford triamterene -hydrochlorothiazide  (MAXZIDE ) 75-50 MG tablet [[id number]] because the discount does not apply to her//She stated she would only be able to get and take the metFORMIN  (GLUCOPHAGE -XR) 500 MG 24 hr tablet [[id number]]//Please call patient she would like to know if something cheaper can be prescribed//

## 2023-07-19 ENCOUNTER — Other Ambulatory Visit (HOSPITAL_COMMUNITY): Payer: Self-pay

## 2023-07-21 ENCOUNTER — Other Ambulatory Visit: Payer: Self-pay | Admitting: Internal Medicine

## 2023-07-21 DIAGNOSIS — Z1231 Encounter for screening mammogram for malignant neoplasm of breast: Secondary | ICD-10-CM

## 2023-08-08 ENCOUNTER — Ambulatory Visit
Admission: RE | Admit: 2023-08-08 | Discharge: 2023-08-08 | Disposition: A | Discharge status: Home / Self Care | Payer: 59 | Source: Ambulatory Visit | Attending: Internal Medicine | Admitting: Internal Medicine

## 2023-08-08 DIAGNOSIS — Z1231 Encounter for screening mammogram for malignant neoplasm of breast: Secondary | ICD-10-CM

## 2023-08-11 ENCOUNTER — Ambulatory Visit: Payer: 59

## 2023-08-15 DIAGNOSIS — E119 Type 2 diabetes mellitus without complications: Secondary | ICD-10-CM | POA: Diagnosis not present

## 2023-08-15 DIAGNOSIS — H524 Presbyopia: Secondary | ICD-10-CM | POA: Diagnosis not present

## 2023-08-15 DIAGNOSIS — H2513 Age-related nuclear cataract, bilateral: Secondary | ICD-10-CM | POA: Diagnosis not present

## 2023-08-15 DIAGNOSIS — H25013 Cortical age-related cataract, bilateral: Secondary | ICD-10-CM | POA: Diagnosis not present

## 2023-08-15 DIAGNOSIS — H43812 Vitreous degeneration, left eye: Secondary | ICD-10-CM | POA: Diagnosis not present

## 2023-08-15 DIAGNOSIS — H40012 Open angle with borderline findings, low risk, left eye: Secondary | ICD-10-CM | POA: Diagnosis not present

## 2023-08-15 LAB — HM DIABETES EYE EXAM

## 2023-08-17 ENCOUNTER — Other Ambulatory Visit (HOSPITAL_COMMUNITY): Payer: Self-pay

## 2023-08-18 ENCOUNTER — Other Ambulatory Visit (HOSPITAL_COMMUNITY): Payer: Self-pay

## 2023-08-18 DIAGNOSIS — M545 Low back pain, unspecified: Secondary | ICD-10-CM | POA: Diagnosis not present

## 2023-08-18 MED ORDER — MELOXICAM 7.5 MG PO TABS
7.5000 mg | ORAL_TABLET | Freq: Every day | ORAL | 0 refills | Status: AC
  Filled 2023-08-18: qty 14, 14d supply, fill #0

## 2023-09-05 ENCOUNTER — Other Ambulatory Visit (HOSPITAL_COMMUNITY): Payer: Self-pay

## 2023-09-05 DIAGNOSIS — M545 Low back pain, unspecified: Secondary | ICD-10-CM | POA: Diagnosis not present

## 2023-09-05 MED ORDER — MELOXICAM 15 MG PO TABS
15.0000 mg | ORAL_TABLET | Freq: Every day | ORAL | 1 refills | Status: DC | PRN
  Filled 2023-09-05: qty 30, 30d supply, fill #0
  Filled 2023-11-14: qty 30, 30d supply, fill #1

## 2023-09-06 ENCOUNTER — Other Ambulatory Visit (HOSPITAL_COMMUNITY): Payer: Self-pay

## 2023-09-16 ENCOUNTER — Other Ambulatory Visit: Payer: Self-pay | Admitting: Internal Medicine

## 2023-09-16 ENCOUNTER — Other Ambulatory Visit (HOSPITAL_COMMUNITY): Payer: Self-pay

## 2023-09-18 ENCOUNTER — Other Ambulatory Visit (HOSPITAL_COMMUNITY): Payer: Self-pay

## 2023-09-18 ENCOUNTER — Telehealth: Payer: Self-pay

## 2023-09-18 ENCOUNTER — Other Ambulatory Visit: Payer: Self-pay

## 2023-09-18 MED ORDER — SITAGLIPTIN PHOSPHATE 25 MG PO TABS
25.0000 mg | ORAL_TABLET | Freq: Every day | ORAL | 1 refills | Status: DC
  Filled 2023-09-18: qty 90, 90d supply, fill #0
  Filled 2023-12-20: qty 90, 90d supply, fill #1

## 2023-09-18 NOTE — Telephone Encounter (Signed)
 Reason for CRM: Patient called in stating that she do not need a refill on the diet pills phentermine.She needs a refill on the Januvia 25 mg

## 2023-09-18 NOTE — Telephone Encounter (Signed)
 Rx sent in

## 2023-09-19 ENCOUNTER — Other Ambulatory Visit (HOSPITAL_COMMUNITY): Payer: Self-pay

## 2023-09-20 ENCOUNTER — Other Ambulatory Visit (HOSPITAL_COMMUNITY): Payer: Self-pay

## 2023-10-23 ENCOUNTER — Other Ambulatory Visit (HOSPITAL_COMMUNITY): Payer: Self-pay

## 2023-11-14 ENCOUNTER — Other Ambulatory Visit (HOSPITAL_COMMUNITY): Payer: Self-pay

## 2023-12-20 ENCOUNTER — Other Ambulatory Visit: Payer: Self-pay | Admitting: Internal Medicine

## 2023-12-20 ENCOUNTER — Other Ambulatory Visit (HOSPITAL_COMMUNITY): Payer: Self-pay

## 2023-12-21 ENCOUNTER — Other Ambulatory Visit (HOSPITAL_COMMUNITY): Payer: Self-pay

## 2023-12-21 ENCOUNTER — Other Ambulatory Visit: Payer: Self-pay

## 2023-12-21 MED ORDER — METFORMIN HCL ER 500 MG PO TB24
500.0000 mg | ORAL_TABLET | Freq: Every day | ORAL | 1 refills | Status: DC
  Filled 2023-12-21: qty 90, 90d supply, fill #0
  Filled 2024-03-14 – 2024-03-16 (×2): qty 90, 90d supply, fill #1

## 2023-12-21 MED ORDER — TRIAMTERENE-HCTZ 75-50 MG PO TABS
1.0000 | ORAL_TABLET | Freq: Every day | ORAL | 1 refills | Status: DC
  Filled 2023-12-21: qty 90, 90d supply, fill #0
  Filled 2024-03-14 – 2024-03-16 (×3): qty 90, 90d supply, fill #1

## 2024-01-30 DIAGNOSIS — N281 Cyst of kidney, acquired: Secondary | ICD-10-CM | POA: Diagnosis not present

## 2024-01-30 DIAGNOSIS — D49512 Neoplasm of unspecified behavior of left kidney: Secondary | ICD-10-CM | POA: Diagnosis not present

## 2024-02-01 ENCOUNTER — Other Ambulatory Visit (HOSPITAL_COMMUNITY): Payer: Self-pay

## 2024-02-01 ENCOUNTER — Other Ambulatory Visit: Payer: Self-pay | Admitting: Internal Medicine

## 2024-02-01 MED ORDER — MELOXICAM 15 MG PO TABS
15.0000 mg | ORAL_TABLET | Freq: Every day | ORAL | 0 refills | Status: AC | PRN
  Filled 2024-02-01: qty 90, 90d supply, fill #0

## 2024-02-03 ENCOUNTER — Other Ambulatory Visit (HOSPITAL_COMMUNITY): Payer: Self-pay

## 2024-02-27 ENCOUNTER — Other Ambulatory Visit (HOSPITAL_COMMUNITY): Payer: Self-pay

## 2024-03-01 DIAGNOSIS — H4051X1 Glaucoma secondary to other eye disorders, right eye, mild stage: Secondary | ICD-10-CM | POA: Diagnosis not present

## 2024-03-01 DIAGNOSIS — H2513 Age-related nuclear cataract, bilateral: Secondary | ICD-10-CM | POA: Diagnosis not present

## 2024-03-01 DIAGNOSIS — H40012 Open angle with borderline findings, low risk, left eye: Secondary | ICD-10-CM | POA: Diagnosis not present

## 2024-03-14 ENCOUNTER — Other Ambulatory Visit: Payer: Self-pay | Admitting: Internal Medicine

## 2024-03-14 ENCOUNTER — Other Ambulatory Visit (HOSPITAL_COMMUNITY): Payer: Self-pay

## 2024-03-14 ENCOUNTER — Other Ambulatory Visit: Payer: Self-pay

## 2024-03-14 MED ORDER — SITAGLIPTIN PHOSPHATE 25 MG PO TABS
25.0000 mg | ORAL_TABLET | Freq: Every day | ORAL | 1 refills | Status: AC
  Filled 2024-03-14 – 2024-03-16 (×3): qty 90, 90d supply, fill #0
  Filled 2024-06-08: qty 90, 90d supply, fill #1

## 2024-03-16 ENCOUNTER — Other Ambulatory Visit (HOSPITAL_COMMUNITY): Payer: Self-pay

## 2024-03-21 ENCOUNTER — Other Ambulatory Visit (HOSPITAL_COMMUNITY): Payer: Self-pay

## 2024-04-23 NOTE — Progress Notes (Signed)
 Annual Comprehensive Physical Exam   Patient Care Team: Perri Ronal PARAS, MD as PCP - General (Internal Medicine)  Visit Date: 04/26/24   Chief Complaint  Patient presents with   Annual Exam   Subjective:  Patient: Allison Shields, Female DOB: 1956-03-06, 68 y.o. MRN: [id number] Vitals:   04/26/24 1048  BP: 120/70   [name] is a 68 y.o. Female who presents today for her Annual Comprehensive Physical Exam. Patient has Glaucoma; Vitamin D  deficiency; Hyperlipidemia; Obesity, unspecified; Metabolic syndrome; Impaired glucose tolerance; Gallstone pancreatitis; Renal insufficiency; Elevated LFTs; and Lesion of left native kidney on their problem list.   She has been having trouble sleeping since her mother passed away recently unexpectedly after orthopedic surgery.  History of degenerative spondylosis of lumbar spine, left shoulder pain, osteoarthritis right knee treated with Gabapentin  300 mg daily, Meloxicam  15 mg daily.   History of Diabetes mellitus, Type II treated with metformin  500 mg daily, sitagliptin  25 mg daily.    History of hyperlipidemia treated with simvastatin  40 mg daily.   History of dependent edema treated with tiamterene-hydrochlorothiazide  75-50 daily. Blood pressure normal in-office today at 120/70.   History of mild obesity. Current weight 171 lb BMI 30.29 She is very active at work. Phentermine  has been prescribed small quantity in the past to assist with her weight loss efforts.  This issue with questionable mass left kidney 1.2 cm not compatible with simple cyst was identified on CT of abdomen and pelvis in April 2021.  This is when she presented to the emergency department with acute cholecystitis.  A solid neoplastic mass or complex cyst could not be excluded and Radiology recommended renal MRI.  Patient did not present here again until October 2021.  We did not pursue MRI of kidney after that visit.  Other issues were addressed.  She has been doing well in  the interim.She is being followed by Dr. Alvaro at [facility]. Thought to have non complex right renal cyst and stable left upper B2 cyst less than 2 cm with no enhancement on MRI   There is a well-circumscribed T2 hyperintense 10 mm right lower pole renal lesion that we have been concerned about without definite postcontrast enhancement but once again lesion is limited by motion.     She is asymptomatic with regard to these lesions and urine specimen obtained here in October showed no evidence of hematuria.  She has no back pain, nausea vomiting or weight loss.   In April 2021 she had gallstone pancreatitis resulting in laparoscopic cholecystectomy.  Had right eye surgery for glaucoma in 2008.  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  History of bilateral cataracts followed by [facility]. When she was admitted for gallstone pancreatitis her creatinine increased to 2.43 but improved to 1.24 and subsequently normalized. This renal finding is concerning to patient due to sister dying from GI malignancy.   Labs pending     08/08/2023 Mammogram No mammographic evidence of malignancy. Repeat in one year.     01/02/2017 Colonoscopy Two 3 mm polyps in the sigmoid colon and in the transverse colon. Resected and retrieved. Pathology found to be non precancerous. Diverticulosis in the cecum. Internal hemorrhoids. The examination was otherwise normal on direct and retroflexion views. Repeat in 10 years.    Vaccine counseling: UTD on Influenza vaccine. Pneumonia vaccine received today. Hepatitis C screening discontinued. Covid-19 vaccine declined.  Health Maintenance  Topic Date Due   Pneumococcal Vaccine: 50+ Years (2  of 2 - PCV) 08/02/2014   HEMOGLOBIN A1C  10/19/2023   Influenza Vaccine  01/12/2024   Diabetic kidney evaluation - eGFR measurement  04/20/2024   Diabetic kidney evaluation - Urine ACR  04/20/2024   COVID-19 Vaccine (4 - 2025-26 season) 05/12/2024  (Originally 02/12/2024)   Hepatitis C Screening  04/26/2025 (Originally 05/25/1974)   OPHTHALMOLOGY EXAM  08/14/2024   Cervical Cancer Screening (HPV/Pap Cotest)  04/03/2025   FOOT EXAM  04/26/2025   Mammogram  08/07/2025   Colonoscopy  01/03/2027   DTaP/Tdap/Td (3 - Td or Tdap) 01/24/2029   HIV Screening  Completed   Zoster Vaccines- Shingrix   Completed   Hepatitis B Vaccines 19-59 Average Risk  Aged Out   Meningococcal B Vaccine  Aged Out   DEXA SCAN  Discontinued     Review of Systems  Constitutional:  Negative for fever and malaise/fatigue.  HENT:  Negative for congestion.   Eyes:  Negative for blurred vision.  Respiratory:  Negative for cough and shortness of breath.   Cardiovascular:  Negative for chest pain, palpitations and leg swelling.  Gastrointestinal:  Negative for vomiting.  Musculoskeletal:  Negative for back pain.  Skin:  Negative for rash.  Neurological:  Negative for loss of consciousness and headaches.   Objective:  Vitals: body mass index is 30.29 kg/m. Today's Vitals   04/26/24 1048  BP: 120/70  Pulse: 80  SpO2: 97%  Weight: 171 lb (77.6 kg)  Height: 5' 3 (1.6 m)  PainSc: 0-No pain   Physical Exam Vitals and nursing note reviewed. Exam conducted with a chaperone present (Araceli valencia, CMA).  Constitutional:      General: She is not in acute distress.    Appearance: Normal appearance. She is not ill-appearing or toxic-appearing.  HENT:     Head: Normocephalic and atraumatic.     Right Ear: Hearing, tympanic membrane, ear canal and external ear normal.     Left Ear: Hearing, tympanic membrane, ear canal and external ear normal.     Mouth/Throat:     Pharynx: Oropharynx is clear.  Eyes:     Extraocular Movements: Extraocular movements intact.     Pupils: Pupils are equal, round, and reactive to light.  Neck:     Thyroid : No thyroid  mass, thyromegaly or thyroid  tenderness.     Vascular: No carotid bruit.  Cardiovascular:     Rate and  Rhythm: Normal rate and regular rhythm. No extrasystoles are present.    Pulses:          Dorsalis pedis pulses are 2+ on the right side and 2+ on the left side.     Heart sounds: Normal heart sounds. No murmur heard.    No friction rub. No gallop.  Pulmonary:     Effort: Pulmonary effort is normal.     Breath sounds: Normal breath sounds. No decreased breath sounds, wheezing, rhonchi or rales.  Chest:     Chest wall: No mass.     Comments:  Fibrocystic changes  Abdominal:     Palpations: Abdomen is soft. There is no hepatomegaly, splenomegaly or mass.     Tenderness: There is no abdominal tenderness.     Hernia: No hernia is present.  Musculoskeletal:     Cervical back: Normal range of motion.     Right lower leg: No edema.     Left lower leg: No edema.  Lymphadenopathy:     Cervical: No cervical adenopathy.     Upper Body:  Right upper body: No supraclavicular adenopathy.     Left upper body: No supraclavicular adenopathy.  Skin:    General: Skin is warm and dry.  Neurological:     General: No focal deficit present.     Mental Status: She is alert and oriented to person, place, and time. Mental status is at baseline.     Sensory: Sensation is intact.     Motor: Motor function is intact. No weakness.     Deep Tendon Reflexes: Reflexes are normal and symmetric.  Psychiatric:        Attention and Perception: Attention normal.        Mood and Affect: Mood normal.        Speech: Speech normal.        Behavior: Behavior normal.        Thought Content: Thought content normal.        Cognition and Memory: Cognition normal.        Judgment: Judgment normal.     Current Outpatient Medications  Medication Instructions   ALPRAZolam  (XANAX ) 0.5 mg, Oral, 2 times daily PRN   Blood Glucose Monitoring Suppl (TRUERESULT BLOOD GLUCOSE) W/DEVICE KIT USE AS DIRECTED   cholecalciferol (VITAMIN D ) 1,000 Units, Daily   gabapentin  (NEURONTIN ) 300 mg, Oral, Daily   Januvia  25 mg, Oral,  Daily   meloxicam  (MOBIC ) 15 mg, Oral, Daily   meloxicam  (MOBIC ) 7.5 mg, Oral, Daily   meloxicam  (MOBIC ) 15 mg, Oral, Daily PRN   metFORMIN  (GLUCOPHAGE -XR) 500 mg, Oral, Daily with breakfast   nystatin  (MYCOSTATIN /NYSTOP ) powder 1 Application, Topical, 3 times daily   phentermine  15 mg, Oral, BH-each morning   simvastatin  (ZOCOR ) 40 MG tablet Take 1 tablet (40 mg) by mouth at bedtime.   triamterene -hydrochlorothiazide  (MAXZIDE ) 75-50 MG tablet 1 tablet, Oral, Daily   Past Medical History:  Diagnosis Date   Dependent edema    Diabetes mellitus    Glaucoma    Hyperlipidemia    Hypertension    Vitamin D  deficiency    Medical/Surgical History Narrative:  Allergic/Intolerant to: No Known Allergies  Past Surgical History:  Procedure Laterality Date   CHOLECYSTECTOMY N/A 09/16/2019   Procedure: LAPAROSCOPIC CHOLECYSTECTOMY;  Surgeon: Signe Mitzie LABOR, MD;  Location: [facility] OR;  Service: General;  Laterality: N/A;   EYE SURGERY  4/08   glaucoma; right eye   TUBAL LIGATION     Family History  Problem Relation Age of Onset   Hypertension Mother    Hyperlipidemia Mother    Diabetes Father    Stroke Father    Cancer Sister    Colon cancer Paternal Uncle    Esophageal cancer Neg Hx    Rectal cancer Neg Hx    Stomach cancer Neg Hx    Family History Narrative: Father died with a stroke with history of diabetes. Mother with history of hypertension, glaucoma, hyperlipidemia, rheumatoid arthritis and essential thrombocytosis. 1 brother alive and well. 1 sister with remote history of sarcoidosis that resolved. 1 sister died of GI malignancy. Another sister with history of morbid obesity. Total of 7 sisters.   Social history: Employed in public affairs consultant at [employer]. Does not smoke or consume alcohol. She has a high school education. She has 2 sons and a granddaughter. She is separated from her husband.   Most Recent Health Risks Assessment:   Most Recent Social Determinants of Health  (Including Hx of Tobacco, Alcohol, and Drug Use) SDOH Screenings   Food Insecurity: No Food Insecurity (04/21/2023)  Housing:  Low Risk  (04/21/2023)  Transportation Needs: No Transportation Needs (04/21/2023)  Utilities: Not At Risk (04/21/2023)  Alcohol Screen: Low Risk  (04/21/2023)  Depression (PHQ2-9): Low Risk  (04/26/2024)  Financial Resource Strain: Low Risk  (04/21/2023)  Physical Activity: Sufficiently Active (04/21/2023)  Stress: No Stress Concern Present (04/21/2023)  Tobacco Use: Low Risk  (04/26/2024)  Health Literacy: Adequate Health Literacy (04/21/2023)   Social History   Tobacco Use   Smoking status: Never   Smokeless tobacco: Never  Vaping Use   Vaping status: Never Used  Substance Use Topics   Alcohol use: Yes    Comment: social   Drug use: No    Most Recent Fall Risk Assessment:    09/02/2022   11:26 AM  Fall Risk   Falls in the past year? 0  Number falls in past yr: 0  Injury with Fall? 0  Risk for fall due to : No Fall Risks  Follow up Falls prevention discussed   Most Recent Anxiety/Depression Screenings:    04/26/2024   10:59 AM 09/02/2022   11:26 AM  PHQ 2/9 Scores  PHQ - 2 Score 0 0    Results:  Studies Obtained And Personally Reviewed By Me:  08/08/2023 Mammogram No mammographic evidence of malignancy. Repeat in one year.     01/02/2017 Colonoscopy Two 3 mm polyps in the sigmoid colon and in the transverse colon. Resected and retrieved. Pathology found to be non precancerous. Diverticulosis in the cecum. Internal hemorrhoids. The examination was otherwise normal on direct and retroflexion views. Repeat in 10 years.    Labs:  CBC w/ Differential Lab Results  Component Value Date   WBC 6.1 04/21/2023   RBC 4.12 04/21/2023   HGB 12.4 04/21/2023   HCT 38.7 04/21/2023   PLT 282 04/21/2023   MCV 93.9 04/21/2023   MCH 30.1 04/21/2023   MCHC 32.0 04/21/2023   RDW 11.7 04/21/2023   MPV 10.4 04/21/2023   LYMPHSABS 2,294 04/15/2022   MONOABS  0.5 08/28/2015   BASOSABS 49 04/21/2023    Comprehensive Metabolic Panel Lab Results  Component Value Date   NA 135 04/21/2023   K 4.0 04/21/2023   CL 98 04/21/2023   CO2 28 04/21/2023   GLUCOSE 92 04/21/2023   BUN 17 04/21/2023   CREATININE 1.00 04/21/2023   CALCIUM 9.4 04/21/2023   PROT 8.3 (H) 04/21/2023   ALBUMIN 2.0 (L) 09/17/2019   AST 14 04/21/2023   ALT 10 04/21/2023   ALKPHOS 123 09/17/2019   BILITOT 0.6 04/21/2023   EGFR 63 04/21/2023   GFRNONAA 58 (L) 04/03/2020   Lipid Panel  Lab Results  Component Value Date   CHOL 172 04/21/2023   HDL 68 04/21/2023   LDLCALC 90 04/21/2023   TRIG 58 04/21/2023   A1c Lab Results  Component Value Date   HGBA1C 6.1 (H) 04/21/2023    TSH Lab Results  Component Value Date   TSH 0.72 04/21/2023    Assessment & Plan:   Orders Placed This Encounter  Procedures   CBC with Differential/Platelet   Comprehensive metabolic panel with GFR   Lipid panel   Hemoglobin A1c   TSH   Microalbumin / creatinine urine ratio   POCT URINALYSIS DIP (CLINITEK)   Meds ordered this encounter  Medications   ALPRAZolam  (XANAX ) 0.5 MG tablet    Sig: Take 1 tablet (0.5 mg total) by mouth 2 (two) times daily as needed for anxiety.    Dispense:  60 tablet  Refill:  1   phentermine  15 MG capsule    Sig: Take 1 capsule (15 mg total) by mouth every morning.    Dispense:  30 capsule    Refill:  0   She has been having trouble sleeping since her mother died.   Xanax  0.5 mg twice daily as needed prescribed for insomnia associated with grief .  Degenerative spondylosis of lumbar spine, left shoulder pain, osteoarthritis right knee: treated with Gabapentin  300 mg daily, Meloxicam  15 mg daily.   Diabetes mellitus, Type II: treated with metformin  500 mg daily, sitagliptin  25 mg daily.    hyperlipidemia treated with simvastatin  40 mg daily.   Dependent edema: treated with tiamterene-hydrochlorothiazide  75-50 daily. Blood pressure normal  in-office today at 120/70.   Obesity: Current weight 171 lb BMI 30.29 She is very active at work. Phentermine  has been prescribed small quantity in the past to assist with her weight loss efforts.   Phentermine  refilled.    Acquired cysts both kidneys being followed at [facility] by Dr. Alvaro.    Labs pending     08/08/2023 Mammogram No mammographic evidence of malignancy. Repeat in one year.     01/02/2017 Colonoscopy Two 3 mm polyps in the sigmoid colon and in the transverse colon. Resected and retrieved. Pathology found to be non precancerous. Diverticulosis in the cecum. Internal hemorrhoids. The examination was otherwise normal on direct and retroflexion views. Repeat in 10 years.    Vaccine counseling: UTD on Influenza vaccine. Pneumonia vaccine received today. Hepatitis C screening discontinued. Covid-19 vaccine declined.      Annual Comprehensive Physical Exam done today including the all of the following: Reviewed patient's Family Medical History Reviewed patient's SDOH and reviewed tobacco, alcohol, and drug use.  Reviewed and updated list of patient's medical providers Assessment of cognitive impairment was done Assessed patient's functional ability Established a written schedule for health screening services Health Risk Assessent Completed and Reviewed  Discussed health benefits of physical activity, and encouraged her to engage in regular exercise appropriate for her age and condition.    I,Makayla C Reid,acting as a scribe for Ronal JINNY Hailstone, MD.,have documented all relevant documentation on the behalf of Ronal JINNY Hailstone, MD,as directed by  Ronal JINNY Hailstone, MD while in the presence of Ronal JINNY Hailstone, MD.  I, Ronal JINNY Hailstone, MD, have reviewed all documentation for and agree with the above Annual Wellness Visit documentation.  Ronal JINNY Hailstone, MD Internal Medicine 04/26/2024

## 2024-04-26 ENCOUNTER — Encounter: Payer: Self-pay | Admitting: Internal Medicine

## 2024-04-26 ENCOUNTER — Ambulatory Visit: Payer: 59 | Admitting: Internal Medicine

## 2024-04-26 ENCOUNTER — Other Ambulatory Visit (HOSPITAL_COMMUNITY): Payer: Self-pay

## 2024-04-26 VITALS — BP 120/70 | HR 80 | Ht 63.0 in | Wt 171.0 lb

## 2024-04-26 DIAGNOSIS — E785 Hyperlipidemia, unspecified: Secondary | ICD-10-CM | POA: Diagnosis not present

## 2024-04-26 DIAGNOSIS — H4031X0 Glaucoma secondary to eye trauma, right eye, stage unspecified: Secondary | ICD-10-CM

## 2024-04-26 DIAGNOSIS — M47896 Other spondylosis, lumbar region: Secondary | ICD-10-CM | POA: Diagnosis not present

## 2024-04-26 DIAGNOSIS — Z9049 Acquired absence of other specified parts of digestive tract: Secondary | ICD-10-CM

## 2024-04-26 DIAGNOSIS — E119 Type 2 diabetes mellitus without complications: Secondary | ICD-10-CM | POA: Diagnosis not present

## 2024-04-26 DIAGNOSIS — E669 Obesity, unspecified: Secondary | ICD-10-CM

## 2024-04-26 DIAGNOSIS — Z8669 Personal history of other diseases of the nervous system and sense organs: Secondary | ICD-10-CM

## 2024-04-26 DIAGNOSIS — Z6830 Body mass index (BMI) 30.0-30.9, adult: Secondary | ICD-10-CM | POA: Diagnosis not present

## 2024-04-26 DIAGNOSIS — M25512 Pain in left shoulder: Secondary | ICD-10-CM

## 2024-04-26 DIAGNOSIS — E7800 Pure hypercholesterolemia, unspecified: Secondary | ICD-10-CM

## 2024-04-26 DIAGNOSIS — Z0000 Encounter for general adult medical examination without abnormal findings: Secondary | ICD-10-CM | POA: Diagnosis not present

## 2024-04-26 DIAGNOSIS — R7302 Impaired glucose tolerance (oral): Secondary | ICD-10-CM

## 2024-04-26 DIAGNOSIS — J309 Allergic rhinitis, unspecified: Secondary | ICD-10-CM

## 2024-04-26 DIAGNOSIS — Z7984 Long term (current) use of oral hypoglycemic drugs: Secondary | ICD-10-CM

## 2024-04-26 DIAGNOSIS — F4320 Adjustment disorder, unspecified: Secondary | ICD-10-CM

## 2024-04-26 DIAGNOSIS — M1711 Unilateral primary osteoarthritis, right knee: Secondary | ICD-10-CM | POA: Diagnosis not present

## 2024-04-26 DIAGNOSIS — N281 Cyst of kidney, acquired: Secondary | ICD-10-CM

## 2024-04-26 DIAGNOSIS — Z23 Encounter for immunization: Secondary | ICD-10-CM

## 2024-04-26 DIAGNOSIS — R600 Localized edema: Secondary | ICD-10-CM | POA: Diagnosis not present

## 2024-04-26 LAB — POCT URINALYSIS DIP (CLINITEK)
Bilirubin, UA: NEGATIVE
Blood, UA: NEGATIVE
Glucose, UA: NEGATIVE mg/dL
Ketones, POC UA: NEGATIVE mg/dL
Leukocytes, UA: NEGATIVE
Nitrite, UA: NEGATIVE
POC PROTEIN,UA: NEGATIVE
Spec Grav, UA: 1.01 (ref 1.010–1.025)
Urobilinogen, UA: 0.2 U/dL
pH, UA: 6.5 (ref 5.0–8.0)

## 2024-04-26 MED ORDER — ALPRAZOLAM 0.5 MG PO TABS
0.5000 mg | ORAL_TABLET | Freq: Two times a day (BID) | ORAL | 1 refills | Status: AC | PRN
  Filled 2024-04-26: qty 60, 30d supply, fill #0

## 2024-04-26 MED ORDER — PHENTERMINE HCL 15 MG PO CAPS
15.0000 mg | ORAL_CAPSULE | ORAL | 0 refills | Status: AC
  Filled 2024-04-26: qty 30, 30d supply, fill #0

## 2024-04-27 LAB — COMPREHENSIVE METABOLIC PANEL WITH GFR
AG Ratio: 0.9 (calc) — ABNORMAL LOW (ref 1.0–2.5)
ALT: 11 U/L (ref 6–29)
AST: 16 U/L (ref 10–35)
Albumin: 4.1 g/dL (ref 3.6–5.1)
Alkaline phosphatase (APISO): 63 U/L (ref 37–153)
BUN/Creatinine Ratio: 16 (calc) (ref 6–22)
BUN: 19 mg/dL (ref 7–25)
CO2: 29 mmol/L (ref 20–32)
Calcium: 9.5 mg/dL (ref 8.6–10.4)
Chloride: 97 mmol/L — ABNORMAL LOW (ref 98–110)
Creat: 1.16 mg/dL — ABNORMAL HIGH (ref 0.50–1.05)
Globulin: 4.8 g/dL — ABNORMAL HIGH (ref 1.9–3.7)
Glucose, Bld: 87 mg/dL (ref 65–99)
Potassium: 3.9 mmol/L (ref 3.5–5.3)
Sodium: 134 mmol/L — ABNORMAL LOW (ref 135–146)
Total Bilirubin: 0.6 mg/dL (ref 0.2–1.2)
Total Protein: 8.9 g/dL — ABNORMAL HIGH (ref 6.1–8.1)
eGFR: 52 mL/min/1.73m2 — ABNORMAL LOW (ref >=60)

## 2024-04-27 LAB — CBC WITH DIFFERENTIAL/PLATELET
Absolute Lymphocytes: 2220 {cells}/uL (ref 850–3900)
Absolute Monocytes: 659 {cells}/uL (ref 200–950)
Basophils Absolute: 61 {cells}/uL (ref 0–200)
Basophils Relative: 1 %
Eosinophils Absolute: 201 {cells}/uL (ref 15–500)
Eosinophils Relative: 3.3 %
HCT: 38.5 % (ref 35.0–45.0)
Hemoglobin: 12.7 g/dL (ref 11.7–15.5)
MCH: 30.7 pg (ref 27.0–33.0)
MCHC: 33 g/dL (ref 32.0–36.0)
MCV: 93 fL (ref 80.0–100.0)
MPV: 10.9 fL (ref 7.5–12.5)
Monocytes Relative: 10.8 %
Neutro Abs: 2959 {cells}/uL (ref 1500–7800)
Neutrophils Relative %: 48.5 %
Platelets: 267 Thousand/uL (ref 140–400)
RBC: 4.14 Million/uL (ref 3.80–5.10)
RDW: 12 % (ref 11.0–15.0)
Total Lymphocyte: 36.4 %
WBC: 6.1 Thousand/uL (ref 3.8–10.8)

## 2024-04-27 LAB — LIPID PANEL
Cholesterol: 187 mg/dL (ref <200)
HDL: 71 mg/dL (ref >=50)
LDL Cholesterol (Calc): 101 mg/dL — ABNORMAL HIGH
Non-HDL Cholesterol (Calc): 116 mg/dL (ref <130)
Total CHOL/HDL Ratio: 2.6 (calc) (ref <5.0)
Triglycerides: 61 mg/dL (ref <150)

## 2024-04-27 LAB — HEMOGLOBIN A1C
Hgb A1c MFr Bld: 5.8 % — ABNORMAL HIGH (ref <5.7)
Mean Plasma Glucose: 120 mg/dL
eAG (mmol/L): 6.6 mmol/L

## 2024-04-27 LAB — MICROALBUMIN / CREATININE URINE RATIO
Creatinine, Urine: 66 mg/dL (ref 20–275)
Microalb, Ur: <0.2 mg/dL

## 2024-04-27 LAB — TSH: TSH: 0.72 m[IU]/L (ref 0.40–4.50)

## 2024-04-28 ENCOUNTER — Ambulatory Visit: Payer: Self-pay | Admitting: Internal Medicine

## 2024-05-10 ENCOUNTER — Encounter: Payer: Self-pay | Admitting: Internal Medicine

## 2024-05-10 NOTE — Patient Instructions (Addendum)
 We are sorry for your loss and understand your grief. Have prescribed Xanax  to take sparingly. Pneumococcal vaccine given. Receives flu vaccine through employment.  Labs drawn and are pending, We will contact you with results. Return in one year or as needed.

## 2024-05-21 ENCOUNTER — Other Ambulatory Visit: Payer: Self-pay | Admitting: Internal Medicine

## 2024-05-21 ENCOUNTER — Other Ambulatory Visit (HOSPITAL_COMMUNITY): Payer: Self-pay

## 2024-05-21 MED ORDER — SIMVASTATIN 40 MG PO TABS
40.0000 mg | ORAL_TABLET | Freq: Every day | ORAL | 3 refills | Status: AC
  Filled 2024-05-21: qty 90, 90d supply, fill #0

## 2024-06-08 ENCOUNTER — Other Ambulatory Visit: Payer: Self-pay | Admitting: Internal Medicine

## 2024-06-08 ENCOUNTER — Other Ambulatory Visit (HOSPITAL_COMMUNITY): Payer: Self-pay

## 2024-06-10 ENCOUNTER — Other Ambulatory Visit (HOSPITAL_COMMUNITY): Payer: Self-pay

## 2024-06-10 MED ORDER — TRIAMTERENE-HCTZ 75-50 MG PO TABS
1.0000 | ORAL_TABLET | Freq: Every day | ORAL | 1 refills | Status: AC
  Filled 2024-06-10: qty 90, 90d supply, fill #0

## 2024-06-10 MED ORDER — METFORMIN HCL ER 500 MG PO TB24
500.0000 mg | ORAL_TABLET | Freq: Every day | ORAL | 1 refills | Status: AC
  Filled 2024-06-10: qty 90, 90d supply, fill #0

## 2024-06-14 ENCOUNTER — Other Ambulatory Visit (HOSPITAL_COMMUNITY): Payer: Self-pay

## 2024-07-02 ENCOUNTER — Other Ambulatory Visit (HOSPITAL_COMMUNITY): Payer: Self-pay

## 2024-07-02 MED ORDER — CELECOXIB 100 MG PO CAPS
100.0000 mg | ORAL_CAPSULE | Freq: Every day | ORAL | 0 refills | Status: DC
  Filled 2024-07-02: qty 14, 14d supply, fill #0

## 2024-07-19 ENCOUNTER — Other Ambulatory Visit (HOSPITAL_COMMUNITY): Payer: Self-pay

## 2024-07-19 MED ORDER — PREDNISONE 10 MG PO TABS
ORAL_TABLET | ORAL | 0 refills | Status: AC
  Filled 2024-07-19: qty 42, 12d supply, fill #0

## 2024-07-19 MED ORDER — CELECOXIB 100 MG PO CAPS
100.0000 mg | ORAL_CAPSULE | Freq: Every day | ORAL | 0 refills | Status: AC
  Filled 2024-07-19: qty 14, 14d supply, fill #0

## 2025-05-02 ENCOUNTER — Encounter: Admitting: Internal Medicine
